# Patient Record
Sex: Female | Born: 1995 | Race: Black or African American | Hispanic: No | Marital: Single | Smoking: Never smoker
Health system: Southern US, Community
[De-identification: ages and names within clinical notes are randomized; demographics above are authoritative.]

## PROBLEM LIST (undated history)

## (undated) DIAGNOSIS — R51 Headache: Secondary | ICD-10-CM

## (undated) DIAGNOSIS — A6000 Herpesviral infection of urogenital system, unspecified: Secondary | ICD-10-CM

## (undated) DIAGNOSIS — M25562 Pain in left knee: Secondary | ICD-10-CM

## (undated) DIAGNOSIS — R519 Headache, unspecified: Secondary | ICD-10-CM

## (undated) DIAGNOSIS — G43109 Migraine with aura, not intractable, without status migrainosus: Secondary | ICD-10-CM

## (undated) HISTORY — DX: Pain in left knee: M25.562

## (undated) HISTORY — DX: Migraine with aura, not intractable, without status migrainosus: G43.109

---
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
## 2017-01-19 IMAGING — US US BREAST LTD UNI LEFT INC AXILLA
1 series · 5 of 5 positions shown · non-contrast
Comparison: No prior exams.

CLINICAL DATA: Palpable abnormalities within the upper and outer
aspects of the left breast. Per the ordering physician, these
palpable masses have clinically enlarged over a period of 1 year.

EXAM:
ULTRASOUND OF THE LEFT BREAST

[Series 1: us breast ltd uni left inc axilla · 0.08mm/px · 5 of 5 slices shown]
[im 1/5]
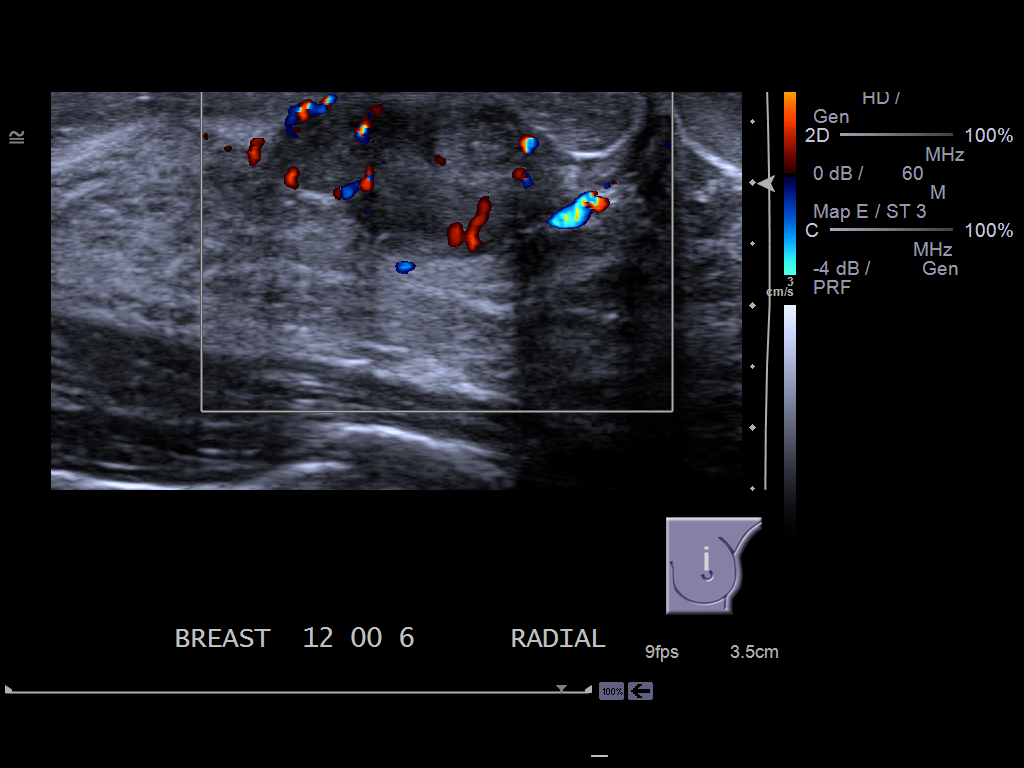
[im 2/5]
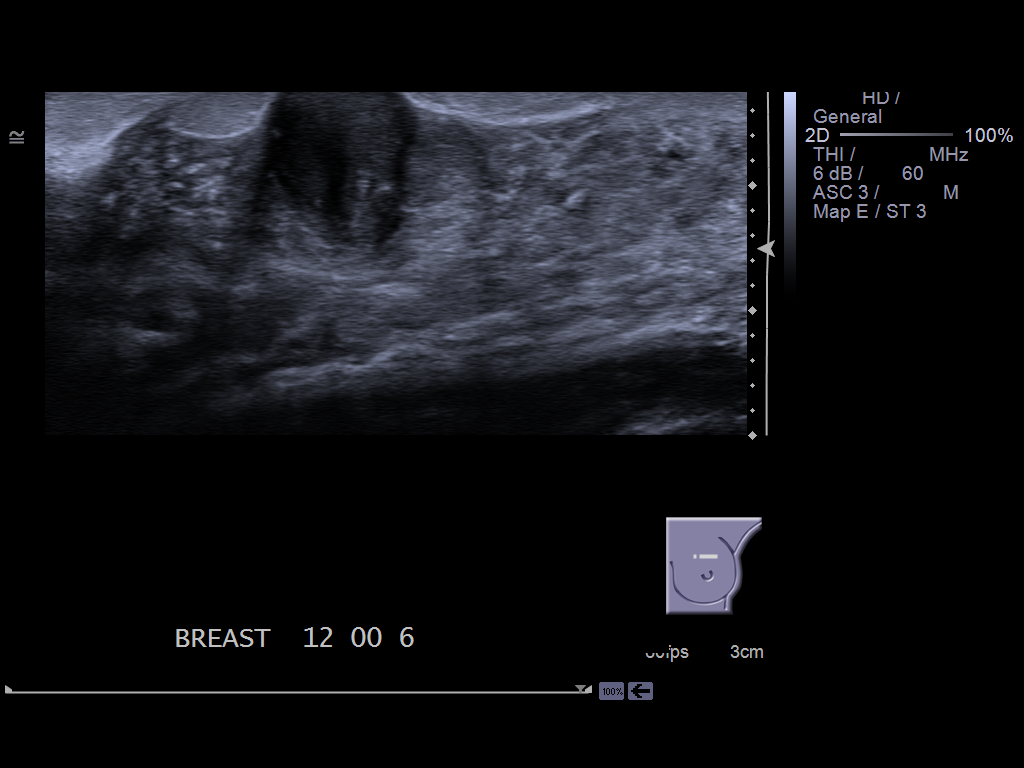
[im 3/5]
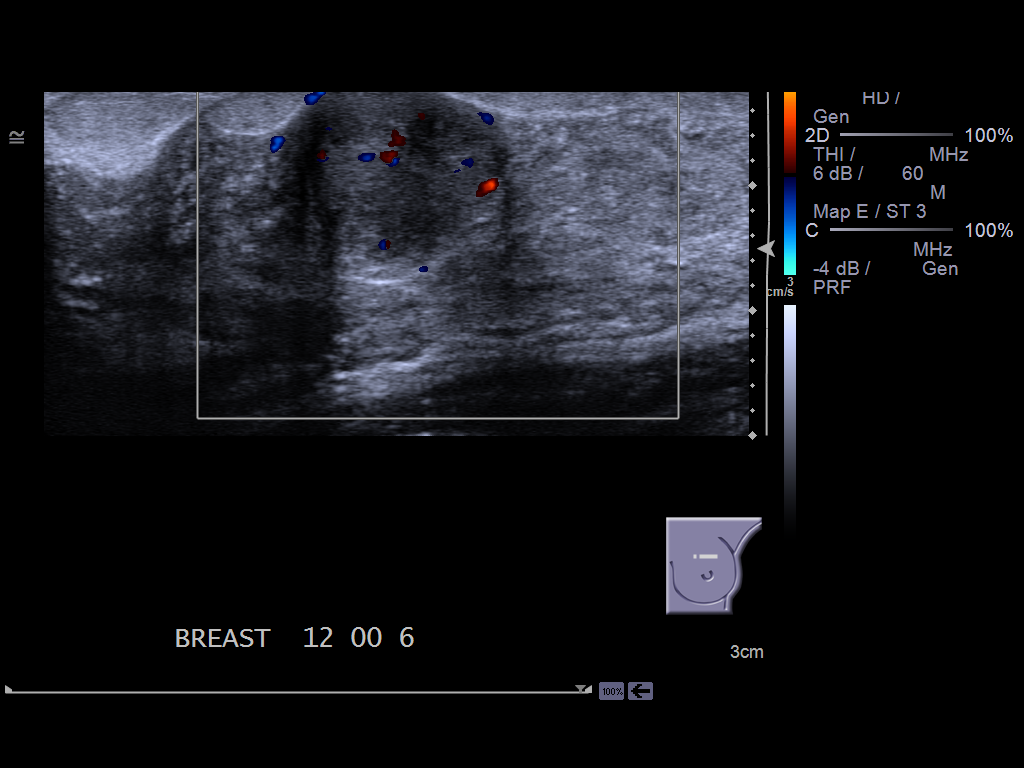
[im 4/5]
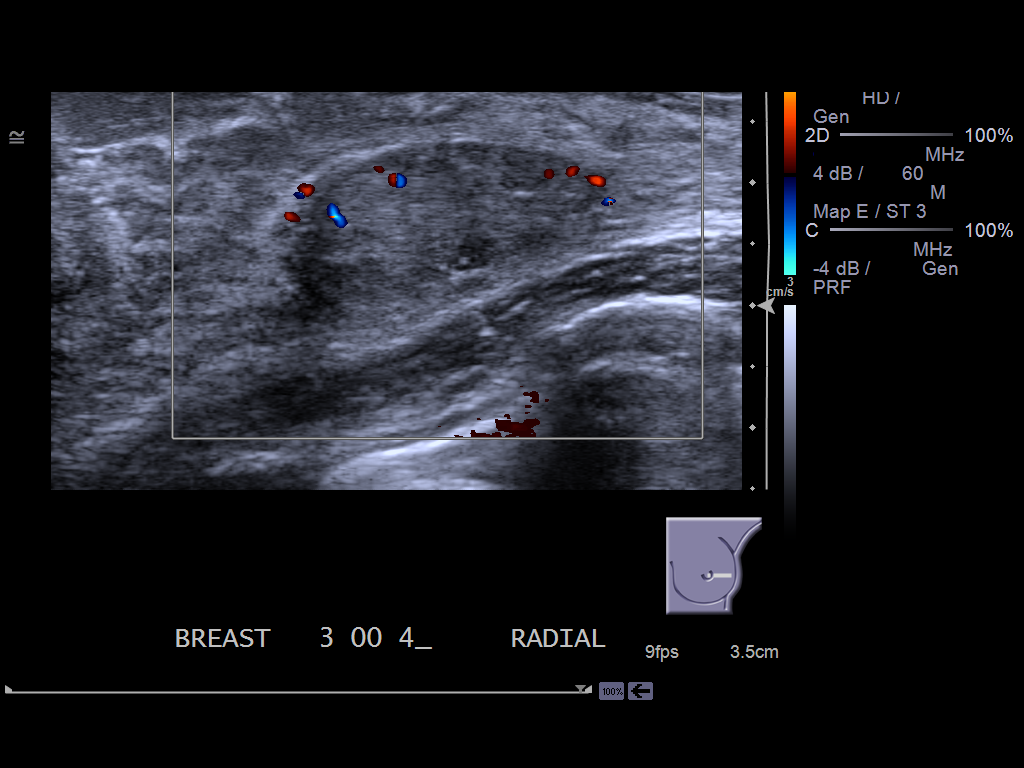
[im 5/5]
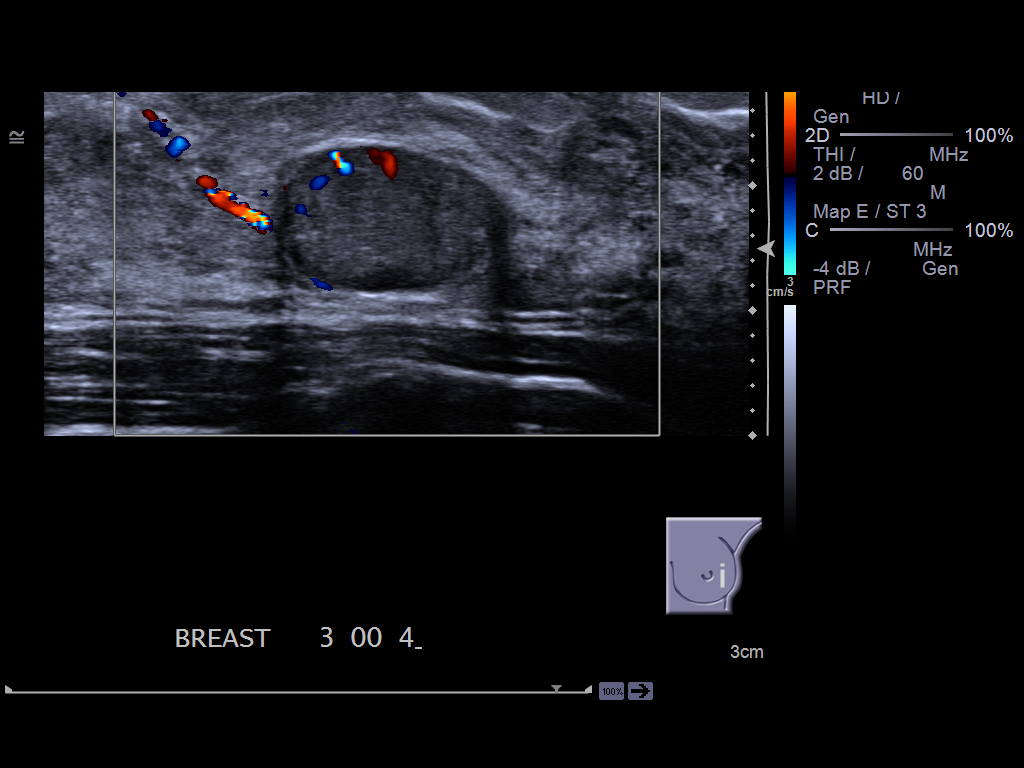

[5 of 5 positions shown; findings below may reference images not displayed]

FINDINGS: On physical exam, palpable masses are felt at the 12 o'clock and 3
o'clock axes of the left breast.

Targeted ultrasound is performed, showing an oval circumscribed
hypoechoic mass within the left breast at the 3 o'clock axis, 4 cm
from the nipple, measuring 3.2 x 1.4 x 2.0 cm, corresponding to the
palpable abnormality.

There is an additional oval circumscribed hypoechoic mass, slightly
lobular, within the left breast at the 12 o'clock axis, 6 cm from
the nipple, measuring 2.7 x 1.4 x 1.8 cm, probably 2 abutting
masses, corresponding to the palpable abnormality.
IMPRESSION: Oval circumscribed hypoechoic masses within the left breast at the 3
o'clock axis and 12 o'clock axis as detailed above. While these are
most likely benign fibroadenomas, ordering physician tells me that
both have clinically enlarged in the past year so ultrasound-guided
biopsy is recommended to ensure benignity.

RECOMMENDATION:
Ultrasound-guided biopsy of either mass within the left breast.
Pathology result can be representative of both masses.

I have discussed the findings and recommendations with the patient.
Results were also provided in writing at the conclusion of the
visit. If applicable, a reminder letter will be sent to the patient
regarding the next appointment.

Findings and recommendations also discussed with the ordering
physician, Dr. On, on 02/13/2015 at 4 p.m.

BI-RADS CATEGORY  4: Suspicious abnormality - biopsy should be
considered.

## 2017-02-08 ENCOUNTER — Ambulatory Visit (INDEPENDENT_AMBULATORY_CARE_PROVIDER_SITE_OTHER): Payer: Medicaid Other | Admitting: Obstetrics & Gynecology

## 2017-02-08 ENCOUNTER — Encounter: Payer: Self-pay | Admitting: Obstetrics & Gynecology

## 2017-02-08 VITALS — BP 120/70 | Ht 60.0 in | Wt 114.0 lb

## 2017-02-08 DIAGNOSIS — Z309 Encounter for contraceptive management, unspecified: Secondary | ICD-10-CM | POA: Diagnosis not present

## 2017-02-08 DIAGNOSIS — Z3042 Encounter for surveillance of injectable contraceptive: Secondary | ICD-10-CM

## 2017-02-08 DIAGNOSIS — Z113 Encounter for screening for infections with a predominantly sexual mode of transmission: Secondary | ICD-10-CM

## 2017-02-08 DIAGNOSIS — Z0000 Encounter for general adult medical examination without abnormal findings: Secondary | ICD-10-CM

## 2017-02-08 MED ORDER — MEDROXYPROGESTERONE ACETATE 150 MG/ML IM SUSP: INTRAMUSCULAR | 3 refills | Status: DC

## 2017-02-08 NOTE — Progress Notes (Signed)
HPI:      [name] is a 21 y.o. G0P0000 who LMP was No LMP recorded. Patient has had an injection., she presents today for her annual examination. The patient has no complaints today. The patient is sexually active. Her no prior history of gyn screening tests. The patient does perform self breast exams.  There is no notable family history of breast or ovarian cancer in her family.  The patient has regular exercise: yes.  The patient denies current symptoms of depression.    GYN History: Contraception: Depo-Provera injections  PMHx: Past Medical History:  Diagnosis Date  . Headache   . Left knee pain    Past Surgical History:  Procedure Laterality Date  . BREAST LUMPECTOMY Left 03/01/2015   Procedure: BREAST LUMPECTOMY/ MASS EXCISION X 2;  Surgeon: Christene Lye, MD;  Location: [facility];  Service: General;  Laterality: Left;   Family History  Problem Relation Age of Onset  . Diabetes Maternal Grandmother    Social History  Substance Use Topics  . Smoking status: Never Smoker  . Smokeless tobacco: Never Used  . Alcohol use No    Current Outpatient Prescriptions:  .  medroxyPROGESTERone (DEPO-PROVERA) 150 MG/ML injection, INJECT 1ML BY ITRAMUSCLAR ROUTE EVERY 3 MONTHS, Disp: 1 mL, Rfl: 3 .  SUMAtriptan (IMITREX) 50 MG tablet, Take 50 mg by mouth every 2 (two) hours as needed for migraine. May repeat in 2 hours if headache persists or recurs., Disp: , Rfl:  Allergies: Penicillins  Review of Systems  Constitutional: Negative for chills, fever and malaise/fatigue.  HENT: Negative for congestion, sinus pain and sore throat.   Eyes: Negative for blurred vision and pain.  Respiratory: Negative for cough and wheezing.   Cardiovascular: Negative for chest pain and leg swelling.  Gastrointestinal: Negative for abdominal pain, constipation, diarrhea, heartburn, nausea and vomiting.  Genitourinary: Negative for dysuria, frequency, hematuria and urgency.    Musculoskeletal: Negative for back pain, joint pain, myalgias and neck pain.  Skin: Negative for itching and rash.  Neurological: Negative for dizziness, tremors and weakness.  Endo/Heme/Allergies: Does not bruise/bleed easily.  Psychiatric/Behavioral: Negative for depression. The patient is not nervous/anxious and does not have insomnia.     Objective: BP 120/70   Ht 5' (1.524 m)   Wt 114 lb (51.7 kg)   BMI 22.26 kg/m   Filed Weights   02/08/17 1021  Weight: 114 lb (51.7 kg)   Body mass index is 22.26 kg/m. Physical Exam  Constitutional: She is oriented to person, place, and time. She appears well-developed and well-nourished. No distress.  Genitourinary: Rectum normal, vagina normal and uterus normal. Pelvic exam was performed with patient supine. There is no rash or lesion on the right labia. There is no rash or lesion on the left labia. Vagina exhibits no lesion. No bleeding in the vagina. Right adnexum does not display mass and does not display tenderness. Left adnexum does not display mass and does not display tenderness. Cervix does not exhibit motion tenderness, lesion, friability or polyp.   Uterus is mobile and midaxial. Uterus is not enlarged or exhibiting a mass.  HENT:  Head: Normocephalic and atraumatic. Head is without laceration.  Right Ear: Hearing normal.  Left Ear: Hearing normal.  Nose: No epistaxis.  No foreign bodies.  Mouth/Throat: Uvula is midline, oropharynx is clear and moist and mucous membranes are normal.  Eyes: Pupils are equal, round, and reactive to light.  Neck: Normal range of motion. Neck supple. No  thyromegaly present.  Cardiovascular: Normal rate and regular rhythm.  Exam reveals no gallop and no friction rub.   No murmur heard. Pulmonary/Chest: Effort normal and breath sounds normal. No respiratory distress. She has no wheezes. Right breast exhibits no mass, no skin change and no tenderness. Left breast exhibits no mass, no skin change and no  tenderness.  Abdominal: Soft. Bowel sounds are normal. She exhibits no distension. There is no tenderness. There is no rebound.  Musculoskeletal: Normal range of motion.  Neurological: She is alert and oriented to person, place, and time. No cranial nerve deficit.  Skin: Skin is warm and dry.  Psychiatric: She has a normal mood and affect. Judgment normal.  Vitals reviewed.   Assessment:  ANNUAL EXAM 1. Annual physical exam   2. Screen for STD (sexually transmitted disease)   3. Encounter for surveillance of injectable contraceptive      Screening Plan:            1.  Cervical Screening-  Pap smear not performed, (await til age 17)  2. Breast screening- Exam annually and mammogram>40 planned   3. Colonoscopy every 10 years, Hemoccult testing - after age 3  4. Labs managed by PCP  5. Counseling for contraception: Depo-Provera  Other:  1. Annual physical exam  2. Screen for STD (sexually transmitted disease) - GC/Chlamydia Probe Amp  3. Encounter for surveillance of injectable contraceptive -Cont Depo    F/U  Return in about 1 year (around 02/08/2018) for Annual.  Barnett Applebaum, MD, [facility], [facility] 02/08/2017  10:43 AM

## 2017-02-08 NOTE — Patient Instructions (Signed)

## 2017-02-10 LAB — GC/CHLAMYDIA PROBE AMP
Chlamydia trachomatis, NAA: NEGATIVE
NEISSERIA GONORRHOEAE BY PCR: NEGATIVE

## 2017-03-10 ENCOUNTER — Other Ambulatory Visit: Payer: Self-pay

## 2017-03-10 MED ORDER — MEDROXYPROGESTERONE ACETATE 150 MG/ML IM SUSP: INTRAMUSCULAR | 0 refills | Status: DC

## 2017-03-10 NOTE — Telephone Encounter (Signed)
Refilling depo one time so pt can come get her injection tomorrow in office. Pt aware

## 2017-03-11 ENCOUNTER — Ambulatory Visit (INDEPENDENT_AMBULATORY_CARE_PROVIDER_SITE_OTHER): Payer: Medicaid Other

## 2017-03-11 DIAGNOSIS — Z3042 Encounter for surveillance of injectable contraceptive: Secondary | ICD-10-CM

## 2017-03-11 DIAGNOSIS — Z309 Encounter for contraceptive management, unspecified: Secondary | ICD-10-CM | POA: Diagnosis not present

## 2017-03-11 MED ORDER — MEDROXYPROGESTERONE ACETATE 150 MG/ML IM SUSP
150.0000 mg | Freq: Once | INTRAMUSCULAR | Status: AC
  Administered 2017-03-11: 150 mg via INTRAMUSCULAR

## 2017-03-12 ENCOUNTER — Ambulatory Visit: Payer: Self-pay

## 2017-03-15 ENCOUNTER — Ambulatory Visit: Payer: Medicaid Other

## 2017-06-04 ENCOUNTER — Ambulatory Visit: Payer: Medicaid Other

## 2017-06-10 ENCOUNTER — Other Ambulatory Visit: Payer: Self-pay | Admitting: Obstetrics & Gynecology

## 2017-06-24 ENCOUNTER — Ambulatory Visit (INDEPENDENT_AMBULATORY_CARE_PROVIDER_SITE_OTHER): Payer: Medicaid Other

## 2017-06-24 DIAGNOSIS — Z309 Encounter for contraceptive management, unspecified: Secondary | ICD-10-CM | POA: Diagnosis not present

## 2017-06-24 DIAGNOSIS — N912 Amenorrhea, unspecified: Secondary | ICD-10-CM | POA: Diagnosis not present

## 2017-06-24 DIAGNOSIS — Z3042 Encounter for surveillance of injectable contraceptive: Secondary | ICD-10-CM

## 2017-06-24 LAB — POCT URINE PREGNANCY: Preg Test, Ur: NEGATIVE

## 2017-06-24 MED ORDER — MEDROXYPROGESTERONE ACETATE 150 MG/ML IM SUSP
150.0000 mg | Freq: Once | INTRAMUSCULAR | Status: AC
  Administered 2017-06-24: 150 mg via INTRAMUSCULAR

## 2017-09-17 ENCOUNTER — Ambulatory Visit: Payer: Medicaid Other

## 2018-05-23 ENCOUNTER — Other Ambulatory Visit (HOSPITAL_COMMUNITY)
Admission: RE | Admit: 2018-05-23 | Discharge: 2018-05-23 | Disposition: A | Discharge status: Home / Self Care | Payer: BLUE CROSS/BLUE SHIELD | Source: Ambulatory Visit | Attending: Obstetrics & Gynecology | Admitting: Obstetrics & Gynecology

## 2018-05-23 ENCOUNTER — Ambulatory Visit (INDEPENDENT_AMBULATORY_CARE_PROVIDER_SITE_OTHER): Payer: Medicaid Other | Admitting: Obstetrics & Gynecology

## 2018-05-23 ENCOUNTER — Encounter: Payer: Self-pay | Admitting: Obstetrics & Gynecology

## 2018-05-23 VITALS — BP 100/60 | Ht 61.0 in | Wt 117.0 lb

## 2018-05-23 DIAGNOSIS — Z0000 Encounter for general adult medical examination without abnormal findings: Secondary | ICD-10-CM | POA: Diagnosis not present

## 2018-05-23 DIAGNOSIS — Z113 Encounter for screening for infections with a predominantly sexual mode of transmission: Secondary | ICD-10-CM

## 2018-05-23 DIAGNOSIS — Z333 Pregnant state, gestational carrier: Secondary | ICD-10-CM | POA: Diagnosis present

## 2018-05-23 NOTE — Patient Instructions (Signed)
Birth control pill pack for one month    Then monitor for cycle regularity  Consider optometry and / or neurology for visual changes and headache investigation

## 2018-05-23 NOTE — Progress Notes (Signed)
HPI:      [name] is a 22 y.o. G0P0000 who LMP was Patient's last menstrual period was 05/16/2018., she presents today for her annual examination. The patient has no complaints today. The patient is sexually active. Her last STD screen was 2018 and normal. The patient does perform self breast exams.  There is no notable family history of breast or ovarian cancer in her family.  The patient has regular exercise: yes.  The patient denies current symptoms of depression.  Visual changes at times, worse in am.  Can be assoc w Headaches (precedes at times)  GYN History: Contraception: none and she stopped Depo in Mar 2019 and still has irreg periods w recent extra bleeding.  PMHx: Past Medical History:  Diagnosis Date  . Headache   . Left knee pain    Past Surgical History:  Procedure Laterality Date  . BREAST LUMPECTOMY Left 03/01/2015   Procedure: BREAST LUMPECTOMY/ MASS EXCISION X 2;  Surgeon: Christene Lye, MD;  Location: [facility] ORS;  Service: General;  Laterality: Left;   Family History  Problem Relation Age of Onset  . Diabetes Maternal Grandmother    Social History   Tobacco Use  . Smoking status: Never Smoker  . Smokeless tobacco: Never Used  Substance Use Topics  . Alcohol use: No    Alcohol/week: 0.0 standard drinks  . Drug use: No    Current Outpatient Medications:  Marland Kitchen  MedroxyPROGESTERone Acetate 150 MG/ML SUSY, INJECT 1ML BY ITRAMUSCLAR ROUTE EVERY 3 MONTHS (Patient not taking: Reported on 05/23/2018), Disp: 1 Syringe, Rfl: 3 .  SUMAtriptan (IMITREX) 50 MG tablet, Take 50 mg by mouth every 2 (two) hours as needed for migraine. May repeat in 2 hours if headache persists or recurs., Disp: , Rfl:  Allergies: Penicillins  Review of Systems  Constitutional: Negative for chills, fever and malaise/fatigue.  HENT: Negative for congestion, sinus pain and sore throat.   Eyes: Negative for blurred vision and pain.       Visual changes  Respiratory: Negative  for cough and wheezing.   Cardiovascular: Negative for chest pain and leg swelling.  Gastrointestinal: Negative for abdominal pain, constipation, diarrhea, heartburn, nausea and vomiting.  Genitourinary: Negative for dysuria, frequency, hematuria and urgency.  Musculoskeletal: Negative for back pain, joint pain, myalgias and neck pain.  Skin: Negative for itching and rash.  Neurological: Positive for headaches. Negative for dizziness, tremors and weakness.  Endo/Heme/Allergies: Does not bruise/bleed easily.  Psychiatric/Behavioral: Negative for depression. The patient is not nervous/anxious and does not have insomnia.     Objective: BP 100/60   Ht 5\' 1"  (1.549 m)   Wt 117 lb (53.1 kg)   LMP 05/16/2018   BMI 22.11 kg/m   Filed Weights   05/23/18 1018  Weight: 117 lb (53.1 kg)   Body mass index is 22.11 kg/m. Physical Exam  Constitutional: She is oriented to person, place, and time. She appears well-developed and well-nourished. No distress.  Genitourinary: Rectum normal, vagina normal and uterus normal. Pelvic exam was performed with patient supine. There is no rash or lesion on the right labia. There is no rash or lesion on the left labia. Vagina exhibits no lesion. No bleeding in the vagina. Right adnexum does not display mass and does not display tenderness. Left adnexum does not display mass and does not display tenderness. Cervix does not exhibit motion tenderness, lesion, friability or polyp.   Uterus is mobile and midaxial. Uterus is not enlarged or exhibiting  a mass.  HENT:  Head: Normocephalic and atraumatic. Head is without laceration.  Right Ear: Hearing normal.  Left Ear: Hearing normal.  Nose: No epistaxis.  No foreign bodies.  Mouth/Throat: Uvula is midline, oropharynx is clear and moist and mucous membranes are normal.  Eyes: Pupils are equal, round, and reactive to light.  Neck: Normal range of motion. Neck supple. No thyromegaly present.  Cardiovascular: Normal  rate and regular rhythm. Exam reveals no gallop and no friction rub.  No murmur heard. Pulmonary/Chest: Effort normal and breath sounds normal. No respiratory distress. She has no wheezes. Right breast exhibits no mass, no skin change and no tenderness. Left breast exhibits no mass, no skin change and no tenderness.  Abdominal: Soft. Bowel sounds are normal. She exhibits no distension. There is no tenderness. There is no rebound.  Musculoskeletal: Normal range of motion.  Neurological: She is alert and oriented to person, place, and time. No cranial nerve deficit.  Skin: Skin is warm and dry.  Psychiatric: She has a normal mood and affect. Judgment normal.  Vitals reviewed.   Assessment:  ANNUAL EXAM 1. Annual physical exam   2. Screen for STD (sexually transmitted disease)      Screening Plan:            1.  Cervical Screening-  DNA probe for chlamydia and GC obtained  2. Breast screening- Exam annually and mammogram>40 planned   3. Colonoscopy every 10 years, Hemoccult testing - after age 9  4. Labs managed by PCP  5. Counseling for contraception: no method, she desires to stay off at this time.  Will take OCP for one month to help regulate cycle again.  Future contraception options discussed   6. Pt counseled to see optometry or even neurology surrounding visual changes she sometimes experiences and also w headache mgt    F/U  Return in about 1 year (around 05/24/2019) for Annual.  Barnett Applebaum, MD, [facility], [facility] 05/23/2018  10:41 AM

## 2018-05-24 LAB — GC/CHLAMYDIA PROBE AMP (~~LOC~~) NOT AT ARMC
Chlamydia: NEGATIVE
Neisseria Gonorrhea: NEGATIVE

## 2018-09-26 ENCOUNTER — Ambulatory Visit: Payer: BLUE CROSS/BLUE SHIELD | Admitting: Family Medicine

## 2018-11-10 ENCOUNTER — Other Ambulatory Visit: Payer: Self-pay

## 2018-11-10 ENCOUNTER — Encounter: Payer: Self-pay | Admitting: Family Medicine

## 2018-11-10 ENCOUNTER — Ambulatory Visit: Payer: BLUE CROSS/BLUE SHIELD | Admitting: Family Medicine

## 2018-11-10 VITALS — BP 100/60 | HR 72 | Ht 61.0 in | Wt 114.0 lb

## 2018-11-10 DIAGNOSIS — Z7689 Persons encountering health services in other specified circumstances: Secondary | ICD-10-CM | POA: Diagnosis not present

## 2018-11-10 NOTE — Progress Notes (Signed)
Date:  11/10/2018   Name:  Natasha Suarez   DOB:  Mar 01, 1996   MRN:  [id number]   Chief Complaint: Establish Care (needs to get established- aged out of peds)  Patient is a 23 year old female who presents for a comprehensive physical exam. The patient reports the following problems: none. Health maintenance has been reviewed up to date.   Review of Systems  Constitutional: Negative.  Negative for chills, fatigue, fever and unexpected weight change.  HENT: Negative for congestion, ear discharge, ear pain, rhinorrhea, sinus pressure, sneezing and sore throat.   Eyes: Negative for photophobia, pain, discharge, redness and itching.  Respiratory: Negative for cough, shortness of breath, wheezing and stridor.   Gastrointestinal: Negative for abdominal pain, blood in stool, constipation, diarrhea, nausea and vomiting.  Endocrine: Negative for cold intolerance, heat intolerance, polydipsia, polyphagia and polyuria.  Genitourinary: Negative for dysuria, flank pain, frequency, hematuria, menstrual problem, pelvic pain, urgency, vaginal bleeding and vaginal discharge.  Musculoskeletal: Negative for arthralgias, back pain and myalgias.  Skin: Negative for rash.  Allergic/Immunologic: Negative for environmental allergies and food allergies.  Neurological: Negative for dizziness, weakness, light-headedness, numbness and headaches.  Hematological: Negative for adenopathy. Does not bruise/bleed easily.  Psychiatric/Behavioral: Negative for dysphoric mood. The patient is not nervous/anxious.     There are no active problems to display for this patient.   Allergies  Allergen Reactions  . Penicillins Rash    Past Surgical History:  Procedure Laterality Date  . BREAST LUMPECTOMY Left 03/01/2015   Procedure: BREAST LUMPECTOMY/ MASS EXCISION X 2;  Surgeon: Christene Lye, MD;  Location: [facility] ORS;  Service: General;  Laterality: Left;    Social History   Tobacco Use  . Smoking status:  Never Smoker  . Smokeless tobacco: Never Used  Substance Use Topics  . Alcohol use: Yes    Alcohol/week: 0.0 standard drinks    Comment: socially  . Drug use: No     Medication list has been reviewed and updated.  Current Meds  Medication Sig  . SUMAtriptan (IMITREX) 50 MG tablet Take 50 mg by mouth every 2 (two) hours as needed for migraine. May repeat in 2 hours if headache persists or recurs.    PHQ 2/9 Scores 11/10/2018  PHQ - 2 Score 0  PHQ- 9 Score 0    BP Readings from Last 3 Encounters:  11/10/18 100/60  05/23/18 100/60  02/08/17 120/70    Physical Exam Vitals signs and nursing note reviewed.  Constitutional:      General: She is not in acute distress.    Appearance: She is not diaphoretic.  HENT:     Head: Normocephalic and atraumatic.     Right Ear: Tympanic membrane, ear canal and external ear normal.     Left Ear: Tympanic membrane, ear canal and external ear normal.     Nose: Nose normal.     Mouth/Throat:     Mouth: Mucous membranes are moist.  Eyes:     General:        Right eye: No discharge.        Left eye: No discharge.     Conjunctiva/sclera: Conjunctivae normal.     Pupils: Pupils are equal, round, and reactive to light.  Neck:     Musculoskeletal: Normal range of motion and neck supple.     Thyroid: No thyromegaly.     Vascular: No JVD.  Cardiovascular:     Rate and Rhythm: Normal rate and regular rhythm.  Heart sounds: Normal heart sounds. No murmur. No friction rub. No gallop.   Pulmonary:     Effort: Pulmonary effort is normal.     Breath sounds: Normal breath sounds. No stridor. No wheezing, rhonchi or rales.  Abdominal:     General: Bowel sounds are normal.     Palpations: Abdomen is soft. There is no mass.     Tenderness: There is no abdominal tenderness. There is no guarding.  Musculoskeletal: Normal range of motion.  Lymphadenopathy:     Cervical: No cervical adenopathy.  Skin:    General: Skin is warm and dry.   Neurological:     Mental Status: She is alert.     Deep Tendon Reflexes: Reflexes are normal and symmetric.     Wt Readings from Last 3 Encounters:  11/10/18 114 lb (51.7 kg)  05/23/18 117 lb (53.1 kg)  02/08/17 114 lb (51.7 kg) (24 %, Z= -0.70)*   * Growth percentiles are based on CDC (Girls, 2-20 Years) data.    BP 100/60   Pulse 72   Ht 5\' 1"  (1.549 m)   Wt 114 lb (51.7 kg)   LMP 11/03/2018 (Approximate)   BMI 21.54 kg/m   Assessment and Plan:  1. Establishing care with new doctor, encounter for Patient establish care with new physician.  Any  previous visits, lab, and procedures were reviewed.  Maintenance was reviewed up to date.

## 2018-12-14 ENCOUNTER — Ambulatory Visit (INDEPENDENT_AMBULATORY_CARE_PROVIDER_SITE_OTHER): Payer: BLUE CROSS/BLUE SHIELD | Admitting: Family Medicine

## 2018-12-14 ENCOUNTER — Encounter: Payer: Self-pay | Admitting: Family Medicine

## 2018-12-14 ENCOUNTER — Other Ambulatory Visit
Admission: RE | Admit: 2018-12-14 | Discharge: 2018-12-14 | Disposition: A | Discharge status: Home / Self Care | Payer: BLUE CROSS/BLUE SHIELD | Attending: Family Medicine | Admitting: Family Medicine

## 2018-12-14 ENCOUNTER — Other Ambulatory Visit: Payer: Self-pay

## 2018-12-14 VITALS — BP 120/70 | HR 72 | Ht 61.0 in | Wt 115.0 lb

## 2018-12-14 DIAGNOSIS — N898 Other specified noninflammatory disorders of vagina: Secondary | ICD-10-CM | POA: Insufficient documentation

## 2018-12-14 DIAGNOSIS — Z113 Encounter for screening for infections with a predominantly sexual mode of transmission: Secondary | ICD-10-CM | POA: Diagnosis not present

## 2018-12-14 LAB — CHLAMYDIA/NGC RT PCR (ARMC ONLY)
Chlamydia Tr: NOT DETECTED
N gonorrhoeae: NOT DETECTED

## 2018-12-14 NOTE — Progress Notes (Signed)
Date:  12/14/2018   Name:  Natasha Suarez   DOB:  [birth year]   MRN:  [id number]   Chief Complaint: Annual Exam (no pap or mammo)  Patient is a 23 year old female who presents for a comprehensive physical exam. The patient reports the following problems: discharge. Health maintenance has been reviewed up to date.  Vaginal Discharge The patient's primary symptoms include vaginal discharge. The patient's pertinent negatives include no genital itching, genital lesions, genital odor, genital rash, missed menses, pelvic pain or vaginal bleeding. Primary symptoms comment: light/clear. This is a recurrent problem. The current episode started 1 to 4 weeks ago (2 weeks). The problem occurs intermittently. The patient is experiencing no pain. Pertinent negatives include no abdominal pain, back pain, chills, constipation, diarrhea, dysuria, fever, flank pain, frequency, headaches, hematuria, nausea, rash, sore throat, urgency or vomiting. The vaginal discharge was clear and mucoid. There has been no bleeding.    Review of Systems  Constitutional: Negative.  Negative for chills, fatigue, fever and unexpected weight change.  HENT: Negative for congestion, ear discharge, ear pain, rhinorrhea, sinus pressure, sneezing and sore throat.   Eyes: Negative for photophobia, pain, discharge, redness and itching.  Respiratory: Negative for cough, shortness of breath, wheezing and stridor.   Gastrointestinal: Negative for abdominal pain, blood in stool, constipation, diarrhea, nausea and vomiting.  Endocrine: Negative for cold intolerance, heat intolerance, polydipsia, polyphagia and polyuria.  Genitourinary: Positive for vaginal discharge. Negative for dysuria, flank pain, frequency, hematuria, menstrual problem, missed menses, pelvic pain, urgency and vaginal bleeding.  Musculoskeletal: Negative for arthralgias, back pain and myalgias.  Skin: Negative for rash.  Allergic/Immunologic: Negative for environmental  allergies and food allergies.  Neurological: Negative for dizziness, weakness, light-headedness, numbness and headaches.  Hematological: Negative for adenopathy. Does not bruise/bleed easily.  Psychiatric/Behavioral: Negative for dysphoric mood. The patient is not nervous/anxious.     There are no active problems to display for this patient.   Allergies  Allergen Reactions  . Penicillins Rash    Past Surgical History:  Procedure Laterality Date  . BREAST LUMPECTOMY Left 03/01/2015   Procedure: BREAST LUMPECTOMY/ MASS EXCISION X 2;  Surgeon: Christene Lye, MD;  Location: [facility] ORS;  Service: General;  Laterality: Left;    Social History   Tobacco Use  . Smoking status: Never Smoker  . Smokeless tobacco: Never Used  Substance Use Topics  . Alcohol use: Yes    Alcohol/week: 0.0 standard drinks    Comment: socially  . Drug use: No     Medication list has been reviewed and updated.  Current Meds  Medication Sig  . SUMAtriptan (IMITREX) 50 MG tablet Take 50 mg by mouth every 2 (two) hours as needed for migraine. May repeat in 2 hours if headache persists or recurs.    PHQ 2/9 Scores 11/10/2018  PHQ - 2 Score 0  PHQ- 9 Score 0    BP Readings from Last 3 Encounters:  12/14/18 120/70  11/10/18 100/60  05/23/18 100/60    Physical Exam Vitals signs and nursing note reviewed.  Constitutional:      Appearance: Normal appearance. She is well-developed and normal weight.  HENT:     Head: Normocephalic.     Jaw: There is normal jaw occlusion.     Right Ear: Hearing, tympanic membrane, ear canal and external ear normal.     Left Ear: Hearing, tympanic membrane, ear canal and external ear normal.     Nose: Nose normal.  Mouth/Throat:     Lips: Pink.     Mouth: Mucous membranes are moist.     Tongue: No lesions. Tongue does not deviate from midline.     Palate: No mass and lesions.     Pharynx: Oropharynx is clear. Uvula midline. No pharyngeal swelling,  oropharyngeal exudate, posterior oropharyngeal erythema or uvula swelling.     Tonsils: No tonsillar exudate or tonsillar abscesses.  Eyes:     General: Lids are normal. Lids are everted, no foreign bodies appreciated. Vision grossly intact. Gaze aligned appropriately. No scleral icterus.       Left eye: No foreign body or hordeolum.     Extraocular Movements: Extraocular movements intact.     Conjunctiva/sclera: Conjunctivae normal.     Right eye: Right conjunctiva is not injected.     Left eye: Left conjunctiva is not injected.     Pupils: Pupils are equal, round, and reactive to light.  Neck:     Musculoskeletal: Full passive range of motion without pain, normal range of motion and neck supple.     Thyroid: No thyroid mass, thyromegaly or thyroid tenderness.     Vascular: No JVD.     Trachea: No tracheal deviation.  Cardiovascular:     Rate and Rhythm: Normal rate and regular rhythm.     Chest Wall: PMI is not displaced. No thrill.     Pulses:          Carotid pulses are 2+ on the right side and 2+ on the left side.      Radial pulses are 2+ on the right side and 2+ on the left side.       Femoral pulses are 2+ on the right side and 2+ on the left side.      Popliteal pulses are 2+ on the right side and 2+ on the left side.       Dorsalis pedis pulses are 2+ on the right side and 2+ on the left side.       Posterior tibial pulses are 2+ on the right side and 2+ on the left side.     Heart sounds: Normal heart sounds, S1 normal and S2 normal. No murmur. No systolic murmur. No diastolic murmur. No friction rub. No gallop. No S3 or S4 sounds.   Pulmonary:     Effort: Pulmonary effort is normal. No respiratory distress.     Breath sounds: Normal breath sounds. No decreased air movement. No decreased breath sounds, wheezing, rhonchi or rales.  Chest:     Chest wall: No mass.     Breasts:        Right: Normal. No swelling, bleeding, inverted nipple, mass, nipple discharge, skin change or  tenderness.        Left: Normal. No swelling, bleeding, inverted nipple, mass, nipple discharge, skin change or tenderness.  Abdominal:     General: Bowel sounds are normal.     Palpations: Abdomen is soft. There is no mass.     Tenderness: There is no abdominal tenderness. There is no guarding or rebound.  Musculoskeletal: Normal range of motion.        General: No tenderness.     Right lower leg: No edema.     Left lower leg: No edema.  Lymphadenopathy:     Head:     Right side of head: No submental or submandibular adenopathy.     Left side of head: No submental or submandibular adenopathy.     Cervical:  No cervical adenopathy.     Right cervical: No superficial, deep or posterior cervical adenopathy.    Left cervical: No superficial, deep or posterior cervical adenopathy.     Upper Body:     Right upper body: No supraclavicular or axillary adenopathy.     Left upper body: No supraclavicular or axillary adenopathy.  Skin:    General: Skin is warm.     Capillary Refill: Capillary refill takes less than 2 seconds.     Findings: No rash.  Neurological:     General: No focal deficit present.     Mental Status: She is alert and oriented to person, place, and time.     Cranial Nerves: No cranial nerve deficit.     Deep Tendon Reflexes: Reflexes normal.  Psychiatric:        Mood and Affect: Mood is not anxious or depressed.        Behavior: Behavior is cooperative.     Wt Readings from Last 3 Encounters:  12/14/18 115 lb (52.2 kg)  11/10/18 114 lb (51.7 kg)  05/23/18 117 lb (53.1 kg)    BP 120/70   Pulse 72   Ht 5\' 1"  (1.549 m)   Wt 115 lb (52.2 kg)   LMP 12/08/2018 (Approximate)   BMI 21.73 kg/m   Assessment and Plan: 1. Vaginal discharge Patient has noted a vaginal discharge which is mucoid in appearance.  In the past she has had to be screened for chlamydia and was having concerns.  Will wait results of test for deciding on location of choice.  2. Screen for STD  (sexually transmitted disease) Patient will be screened with quick chlamydia/GC per outpatient screening.

## 2018-12-28 DIAGNOSIS — A6000 Herpesviral infection of urogenital system, unspecified: Secondary | ICD-10-CM

## 2018-12-28 HISTORY — DX: Herpesviral infection of urogenital system, unspecified: A60.00

## 2019-01-08 ENCOUNTER — Encounter: Payer: Self-pay | Admitting: Emergency Medicine

## 2019-01-08 ENCOUNTER — Emergency Department: Payer: BLUE CROSS/BLUE SHIELD

## 2019-01-08 ENCOUNTER — Other Ambulatory Visit: Payer: Self-pay

## 2019-01-08 ENCOUNTER — Emergency Department
Admission: EM | Admit: 2019-01-08 | Discharge: 2019-01-08 | Disposition: A | Discharge status: Home / Self Care | Payer: BLUE CROSS/BLUE SHIELD | Attending: Emergency Medicine | Admitting: Emergency Medicine

## 2019-01-08 DIAGNOSIS — Z79899 Other long term (current) drug therapy: Secondary | ICD-10-CM | POA: Insufficient documentation

## 2019-01-08 DIAGNOSIS — N3000 Acute cystitis without hematuria: Secondary | ICD-10-CM | POA: Insufficient documentation

## 2019-01-08 DIAGNOSIS — E860 Dehydration: Secondary | ICD-10-CM | POA: Insufficient documentation

## 2019-01-08 DIAGNOSIS — R112 Nausea with vomiting, unspecified: Secondary | ICD-10-CM | POA: Diagnosis present

## 2019-01-08 LAB — URINALYSIS, COMPLETE (UACMP) WITH MICROSCOPIC
Bacteria, UA: NONE SEEN
Bilirubin Urine: NEGATIVE
Glucose, UA: NEGATIVE mg/dL
Ketones, ur: 80 mg/dL — AB
Leukocytes,Ua: NEGATIVE
Nitrite: NEGATIVE
Protein, ur: 100 mg/dL — AB
Specific Gravity, Urine: 1.026 (ref 1.005–1.030)
pH: 6 (ref 5.0–8.0)

## 2019-01-08 LAB — COMPREHENSIVE METABOLIC PANEL
ALT: 14 U/L (ref 0–44)
AST: 19 U/L (ref 15–41)
Albumin: 4 g/dL (ref 3.5–5.0)
Alkaline Phosphatase: 46 U/L (ref 38–126)
Anion gap: 11 (ref 5–15)
BUN: 13 mg/dL (ref 6–20)
CO2: 22 mmol/L (ref 22–32)
Calcium: 8.7 mg/dL — ABNORMAL LOW (ref 8.9–10.3)
Chloride: 103 mmol/L (ref 98–111)
Creatinine, Ser: 0.79 mg/dL (ref 0.44–1.00)
GFR calc Af Amer: >60 mL/min (ref >60)
GFR calc non Af Amer: >60 mL/min (ref >60)
Glucose, Bld: 101 mg/dL — ABNORMAL HIGH (ref 70–99)
Potassium: 3.4 mmol/L — ABNORMAL LOW (ref 3.5–5.1)
Sodium: 136 mmol/L (ref 135–145)
Total Bilirubin: 1.3 mg/dL — ABNORMAL HIGH (ref 0.3–1.2)
Total Protein: 7.1 g/dL (ref 6.5–8.1)

## 2019-01-08 LAB — POCT PREGNANCY, URINE: Preg Test, Ur: NEGATIVE

## 2019-01-08 LAB — CBC
HCT: 38.4 % (ref 36.0–46.0)
Hemoglobin: 13.4 g/dL (ref 12.0–15.0)
MCH: 34.7 pg — ABNORMAL HIGH (ref 26.0–34.0)
MCHC: 34.9 g/dL (ref 30.0–36.0)
MCV: 99.5 fL (ref 80.0–100.0)
Platelets: 139 10*3/uL — ABNORMAL LOW (ref 150–400)
RBC: 3.86 MIL/uL — ABNORMAL LOW (ref 3.87–5.11)
RDW: 12.2 % (ref 11.5–15.5)
WBC: 6.8 10*3/uL (ref 4.0–10.5)
nRBC: 0 % (ref 0.0–0.2)

## 2019-01-08 LAB — LIPASE, BLOOD: Lipase: 24 U/L (ref 11–51)

## 2019-01-08 MED ORDER — SODIUM CHLORIDE 0.9% FLUSH: 3.0000 mL | Freq: Once | INTRAVENOUS | Status: DC

## 2019-01-08 MED ORDER — IBUPROFEN 600 MG PO TABS: 600.0000 mg | ORAL_TABLET | Freq: Three times a day (TID) | ORAL | 0 refills | Status: DC | PRN

## 2019-01-08 MED ORDER — CEPHALEXIN 500 MG PO CAPS: 500.0000 mg | ORAL_CAPSULE | Freq: Three times a day (TID) | ORAL | 0 refills | Status: AC

## 2019-01-08 MED ORDER — SODIUM CHLORIDE 0.9 % IV SOLN
2.0000 g | Freq: Once | INTRAVENOUS | Status: AC
  Administered 2019-01-08: 2 g via INTRAVENOUS
  Filled 2019-01-08: qty 20

## 2019-01-08 MED ORDER — SODIUM CHLORIDE 0.9 % IV BOLUS
1000.0000 mL | Freq: Once | INTRAVENOUS | Status: AC
  Administered 2019-01-08: 1000 mL via INTRAVENOUS

## 2019-01-08 MED ORDER — ONDANSETRON 4 MG PO TBDP: 4.0000 mg | ORAL_TABLET | Freq: Three times a day (TID) | ORAL | 0 refills | Status: DC | PRN

## 2019-01-08 MED ORDER — KETOROLAC TROMETHAMINE 30 MG/ML IJ SOLN
15.0000 mg | Freq: Once | INTRAMUSCULAR | Status: AC
  Administered 2019-01-08: 15 mg via INTRAVENOUS
  Filled 2019-01-08: qty 1

## 2019-01-08 MED ORDER — ONDANSETRON HCL 4 MG/2ML IJ SOLN
4.0000 mg | Freq: Once | INTRAMUSCULAR | Status: AC
  Administered 2019-01-08: 09:00:00 4 mg via INTRAVENOUS
  Filled 2019-01-08: qty 2

## 2019-01-08 MED ORDER — IOHEXOL 300 MG/ML  SOLN
75.0000 mL | Freq: Once | INTRAMUSCULAR | Status: AC | PRN
  Administered 2019-01-08: 75 mL via INTRAVENOUS

## 2019-01-08 NOTE — ED Provider Notes (Signed)
[facility] Emergency Department Provider Note  ____________________________________________   First MD Initiated Contact with Patient 01/08/19 0802     (approximate)  I have reviewed the triage vital signs and the nursing notes.   HISTORY  Chief Complaint Fever and Emesis    HPI Natasha Suarez is a 23 y.o. female here with fever and nausea with vomiting.  The patient states her symptoms started approximately 3 days ago.  She states that it started as a low-grade fever up to 101 Fahrenheit.  She had some mild midline lower back pain with it as well, but thought this was due to menstrual periods.  She states that over the last 24 hours, she is developed persistent nausea, vomiting, and general fatigue.  She has had no diarrhea or constipation.  She states that she has had some intermittent abdominal cramping when she vomits but this resolves very quickly.  She denies any vaginal bleeding or discharge.  No other acute complaints.  No known sick contacts, but she has been out to the mall.  No cough, shortness of breath, or known coronavirus exposures.  No recent travel.  No rash.  Her nausea is worse with any attempted eating or drinking.  No alleviating factors.        Past Medical History:  Diagnosis Date   Headache    Left knee pain     There are no active problems to display for this patient.   Past Surgical History:  Procedure Laterality Date   BREAST LUMPECTOMY Left 03/01/2015   Procedure: BREAST LUMPECTOMY/ MASS EXCISION X 2;  Surgeon: Christene Lye, MD;  Location: [facility] ORS;  Service: General;  Laterality: Left;    Prior to Admission medications   Medication Sig Start Date End Date Taking? Authorizing Provider  cephALEXin (KEFLEX) 500 MG capsule Take 1 capsule (500 mg total) by mouth 3 (three) times daily for 7 days. 01/08/19 01/15/19  Duffy Bruce, MD  ibuprofen (ADVIL) 600 MG tablet Take 1 tablet (600 mg total) by mouth every 8  (eight) hours as needed for moderate pain (back pain). 01/08/19   Duffy Bruce, MD  ondansetron (ZOFRAN ODT) 4 MG disintegrating tablet Take 1 tablet (4 mg total) by mouth every 8 (eight) hours as needed for nausea or vomiting. 01/08/19   Duffy Bruce, MD  SUMAtriptan (IMITREX) 50 MG tablet Take 50 mg by mouth every 2 (two) hours as needed for migraine. May repeat in 2 hours if headache persists or recurs.    [provider]    Allergies Penicillins  Family History  Problem Relation Age of Onset   Diabetes Maternal Grandmother    Hypertension Mother     Social History Social History   Tobacco Use   Smoking status: Never Smoker   Smokeless tobacco: Never Used  Substance Use Topics   Alcohol use: Yes    Alcohol/week: 0.0 standard drinks    Comment: socially   Drug use: No    Review of Systems  Review of Systems  Constitutional: Positive for chills and fever. Negative for fatigue.  HENT: Negative for congestion and sore throat.   Eyes: Negative for visual disturbance.  Respiratory: Negative for cough and shortness of breath.   Cardiovascular: Negative for chest pain.  Gastrointestinal: Positive for nausea and vomiting. Negative for abdominal pain and diarrhea.  Genitourinary: Negative for flank pain.  Musculoskeletal: Negative for back pain and neck pain.  Skin: Negative for rash and wound.  Neurological: Negative for weakness.  All other systems reviewed and are negative.    ____________________________________________  PHYSICAL EXAM:      VITAL SIGNS: ED Triage Vitals  Enc Vitals Group     BP 01/08/19 0722 102/83     Pulse Rate 01/08/19 0722 98     Resp 01/08/19 0722 18     Temp 01/08/19 0722 100 F (37.8 C)     Temp Source 01/08/19 0722 Oral     SpO2 01/08/19 0722 100 %     Weight 01/08/19 0722 115 lb (52.2 kg)     Height 01/08/19 0722 5\' 1"  (1.549 m)     Head Circumference --      Peak Flow --      Pain Score 01/08/19 0725 0     Pain  Loc --      Pain Edu? --      Excl. in [place]? --      Physical Exam Vitals signs and nursing note reviewed.  Constitutional:      General: She is not in acute distress.    Appearance: She is well-developed.  HENT:     Head: Normocephalic and atraumatic.     Mouth/Throat:     Mouth: Mucous membranes are dry.  Eyes:     Conjunctiva/sclera: Conjunctivae normal.  Neck:     Musculoskeletal: Neck supple.  Cardiovascular:     Rate and Rhythm: Normal rate and regular rhythm.     Heart sounds: Normal heart sounds. No murmur. No friction rub.  Pulmonary:     Effort: Pulmonary effort is normal. No respiratory distress.     Breath sounds: Normal breath sounds. No wheezing or rales.  Abdominal:     General: There is no distension.     Palpations: Abdomen is soft.     Tenderness: There is no abdominal tenderness. There is no guarding or rebound.  Skin:    General: Skin is warm.     Capillary Refill: Capillary refill takes less than 2 seconds.  Neurological:     Mental Status: She is alert and oriented to person, place, and time.     Motor: No abnormal muscle tone.       ____________________________________________   LABS (all labs ordered are listed, but only abnormal results are displayed)  Labs Reviewed  COMPREHENSIVE METABOLIC PANEL - Abnormal; Notable for the following components:      Result Value   Potassium 3.4 (*)    Glucose, Bld 101 (*)    Calcium 8.7 (*)    Total Bilirubin 1.3 (*)    All other components within normal limits  CBC - Abnormal; Notable for the following components:   RBC 3.86 (*)    MCH 34.7 (*)    Platelets 139 (*)    All other components within normal limits  URINALYSIS, COMPLETE (UACMP) WITH MICROSCOPIC - Abnormal; Notable for the following components:   Color, Urine YELLOW (*)    APPearance HAZY (*)    Hgb urine dipstick LARGE (*)    Ketones, ur 80 (*)    Protein, ur 100 (*)    All other components within normal limits  URINE CULTURE    LIPASE, BLOOD  POC URINE PREG, ED  POCT PREGNANCY, URINE    ____________________________________________  EKG:  ________________________________________  RADIOLOGY All imaging, including plain films, CT scans, and ultrasounds, independently reviewed by me, and interpretations confirmed via formal radiology reads.  ED MD interpretation:   CT: No acute abnormality, R ovarian cyst noted  Official radiology  report(s): Ct Abdomen Pelvis W Contrast  Result Date: 01/08/2019 CLINICAL DATA:  Right-sided abdominal pain, vomiting, and fever for 2 days. EXAM: CT ABDOMEN AND PELVIS WITH CONTRAST TECHNIQUE: Multidetector CT imaging of the abdomen and pelvis was performed using the standard protocol following bolus administration of intravenous contrast. CONTRAST:  67mL OMNIPAQUE IOHEXOL 300 MG/ML  SOLN COMPARISON:  None. FINDINGS: Lower Chest: No acute findings. Hepatobiliary: No hepatic masses identified. Gallbladder is unremarkable. Pancreas:  No mass or inflammatory changes. Spleen: Within normal limits in size and appearance. Adrenals/Urinary Tract: No masses identified. No evidence of hydronephrosis. Stomach/Bowel: No evidence of obstruction, inflammatory process or abnormal fluid collections. Normal appendix visualized. Vascular/Lymphatic: No pathologically enlarged lymph nodes. No abdominal aortic aneurysm. Reproductive: Normal appearance of uterus. A benign-appearing cyst is seen in the right adnexa which measures 3.4 x 2.4 cm. This most likely represents a physiologic ovarian cyst in a reproductive age female. No other masses identified. No evidence of inflammatory changes or free fluid. Other:  None. Musculoskeletal:  No suspicious bone lesions identified. IMPRESSION: 1. 3.4 cm benign-appearing right ovarian cyst, most likely physiologic in a reproductive age female. 2. No evidence of appendicitis or other inflammatory process. Electronically Signed   By: Marlaine Hind M.D.   On: 01/08/2019 11:28     ____________________________________________  PROCEDURES   Procedure(s) performed (including Critical Care):  Procedures  ____________________________________________  INITIAL IMPRESSION / MDM / [place] / ED COURSE  As part of my medical decision making, I reviewed the following data within the electronic MEDICAL RECORD NUMBER Notes from prior ED visits and [place] Controlled Substance Database      *Naesha S No was evaluated in Emergency Department on 01/08/2019 for the symptoms described in the history of present illness. She was evaluated in the context of the global COVID-19 pandemic, which necessitated consideration that the patient might be at risk for infection with the SARS-CoV-2 virus that causes COVID-19. Institutional protocols and algorithms that pertain to the evaluation of patients at risk for COVID-19 are in a state of rapid change based on information released by regulatory bodies including the CDC and federal and state organizations. These policies and algorithms were followed during the patient's care in the ED.  Some ED evaluations and interventions may be delayed as a result of limited staffing during the pandemic.*   Clinical Course as of Jan 08 1155  Sun Jan 08, 2019  0825 22 yo F here with fever, nausea, vomiting. DDx includes UTI, viral GI illness, COVID, less likely colitis. No focal RLQ TTP on initial exam, making appendicitis unlikely. No anorexia noted. Will f/u labs, sx control, and re-assess.   [CI]  1040 Labs as above.  Urinalysis does show significant ketonuria as well as mild pyuria and hematuria.  While she is on her period, this could also represent a mild UTI.  Rocephin ordered.  Given that her tenderness is now localizing to the right lower quadrant, will obtain CT to rule out appendicitis.   [CI]    Clinical Course User Index [CI] Duffy Bruce, MD    Medical Decision Making: As above.  Suspect nausea, vomiting, secondary to her  complicated by UTI.  No evidence of appendicitis, cholecystitis, or other inflammatory process on CT imaging.  She has an ovarian cyst but is currently on her menstrual period, and has no pain to suggest torsion.  Will start on antibiotics.  She been given a dose of Rocephin here, which he tolerated well with no allergy.  Return precautions given.  Tolerating p.o. without difficulty.  ____________________________________________  FINAL CLINICAL IMPRESSION(S) / ED DIAGNOSES  Final diagnoses:  Acute cystitis without hematuria  Dehydration     MEDICATIONS GIVEN DURING THIS VISIT:  Medications  sodium chloride flush (NS) 0.9 % injection 3 mL (3 mLs Intravenous Not Given 01/08/19 1110)  sodium chloride 0.9 % bolus 1,000 mL (0 mLs Intravenous Stopped 01/08/19 1117)  ondansetron (ZOFRAN) injection 4 mg (4 mg Intravenous Given 01/08/19 0840)  ketorolac (TORADOL) 30 MG/ML injection 15 mg (15 mg Intravenous Given 01/08/19 0840)  cefTRIAXone (ROCEPHIN) 2 g in sodium chloride 0.9 % 100 mL IVPB (0 g Intravenous Stopped 01/08/19 1110)  sodium chloride 0.9 % bolus 1,000 mL (0 mLs Intravenous Stopped 01/08/19 1143)  iohexol (OMNIPAQUE) 300 MG/ML solution 75 mL (75 mLs Intravenous Contrast Given 01/08/19 1109)     ED Discharge Orders         Ordered    cephALEXin (KEFLEX) 500 MG capsule  3 times daily     01/08/19 1154    ondansetron (ZOFRAN ODT) 4 MG disintegrating tablet  Every 8 hours PRN     01/08/19 1154    ibuprofen (ADVIL) 600 MG tablet  Every 8 hours PRN     01/08/19 1154           Note:  This document was prepared using Dragon voice recognition software and may include unintentional dictation errors.   Duffy Bruce, MD 01/08/19 [phone]

## 2019-01-08 NOTE — ED Notes (Signed)
Patient transported to CT 

## 2019-01-08 NOTE — ED Notes (Signed)
ED Provider at bedside. 

## 2019-01-08 NOTE — ED Notes (Signed)
Urine collected and sent to lab per MD order. Pt denies complaints at this time.

## 2019-01-08 NOTE — ED Triage Notes (Signed)
Pt to ED via POV c/o fever x 2 days and vomiting that started last night. Pt states that she has vomited x 4. Pt reports that she unable to eat. Pt has been taken OTC medication for fever at home. Pt is in NAD.

## 2019-01-08 NOTE — ED Notes (Signed)
Esign not working at this time. Pt verbalized discharge instructions and has no quesitons

## 2019-01-08 NOTE — Discharge Instructions (Signed)

## 2019-01-11 LAB — URINE CULTURE

## 2019-01-17 ENCOUNTER — Encounter: Payer: Self-pay | Admitting: Obstetrics and Gynecology

## 2019-01-17 ENCOUNTER — Ambulatory Visit (INDEPENDENT_AMBULATORY_CARE_PROVIDER_SITE_OTHER): Payer: Self-pay | Admitting: Obstetrics and Gynecology

## 2019-01-17 ENCOUNTER — Other Ambulatory Visit: Payer: Self-pay

## 2019-01-17 VITALS — BP 110/70 | Ht 61.0 in | Wt 113.6 lb

## 2019-01-17 DIAGNOSIS — N898 Other specified noninflammatory disorders of vagina: Secondary | ICD-10-CM

## 2019-01-17 DIAGNOSIS — N83201 Unspecified ovarian cyst, right side: Secondary | ICD-10-CM | POA: Insufficient documentation

## 2019-01-17 NOTE — Progress Notes (Signed)
Juline Patch, MD   Chief Complaint  Patient presents with  . Vaginal Bump    outside on right side, painful x week and a half  . Follow-up    urine recheck from ER visit  . Ovarian Cyst    was told at ER she had an ovarian cyst, has questions regarding    HPI:      [name] is a 23 y.o. G0P0000 who LMP was Patient's last menstrual period was 01/04/2019 (exact date)., presents today for vaginal bump RT labia majora for about  1 1/2 wks. Area tender, hasn't drained. No other vag sx of d/c, odor. Pt seen in ED 01/08/19 with fever, vomiting. Thought to have UTI and started on keflex, but C&S ended up being neg. Had CT scan with "3.4 cm benign-appearing right ovarian cyst, most likely physiologic in a reproductive age female". Pt has had RLQ discomfort with menses the past few cycles but no other pelvic pain.  She is sex active, using condoms. No dyspareunia. Had neg STD testing 6/20 but has new partner since then.    Past Medical History:  Diagnosis Date  . Headache   . Left knee pain     Past Surgical History:  Procedure Laterality Date  . BREAST LUMPECTOMY Left 03/01/2015   Procedure: BREAST LUMPECTOMY/ MASS EXCISION X 2;  Surgeon: Christene Lye, MD;  Location: [facility] ORS;  Service: General;  Laterality: Left;    Family History  Problem Relation Age of Onset  . Diabetes Maternal Grandmother   . Hypertension Mother     Social History   Socioeconomic History  . Marital status: Single    Spouse name: Not on file  . Number of children: Not on file  . Years of education: Not on file  . Highest education level: Not on file  Occupational History  . Not on file  Social Needs  . Financial resource strain: Not on file  . Food insecurity    Worry: Not on file    Inability: Not on file  . Transportation needs    Medical: Not on file    Non-medical: Not on file  Tobacco Use  . Smoking status: Never Smoker  . Smokeless tobacco: Never Used  Substance and  Sexual Activity  . Alcohol use: Yes    Alcohol/week: 0.0 standard drinks    Comment: socially  . Drug use: No  . Sexual activity: Yes    Birth control/protection: Condom  Lifestyle  . Physical activity    Days per week: Not on file    Minutes per session: Not on file  . Stress: Not on file  Relationships  . Social Herbalist on phone: Not on file    Gets together: Not on file    Attends religious service: Not on file    Active member of club or organization: Not on file    Attends meetings of clubs or organizations: Not on file    Relationship status: Not on file  . Intimate partner violence    Fear of current or ex partner: Not on file    Emotionally abused: Not on file    Physically abused: Not on file    Forced sexual activity: Not on file  Other Topics Concern  . Not on file  Social History Narrative  . Not on file    Outpatient Medications Prior to Visit  Medication Sig Dispense Refill  . SUMAtriptan (IMITREX) 50  MG tablet Take 50 mg by mouth every 2 (two) hours as needed for migraine. May repeat in 2 hours if headache persists or recurs.    Marland Kitchen ibuprofen (ADVIL) 600 MG tablet Take 1 tablet (600 mg total) by mouth every 8 (eight) hours as needed for moderate pain (back pain). (Patient not taking: Reported on 01/17/2019) 20 tablet 0  . ondansetron (ZOFRAN ODT) 4 MG disintegrating tablet Take 1 tablet (4 mg total) by mouth every 8 (eight) hours as needed for nausea or vomiting. (Patient not taking: Reported on 01/17/2019) 20 tablet 0   No facility-administered medications prior to visit.       ROS:  Review of Systems  Constitutional: Positive for fever. Negative for fatigue and unexpected weight change.  Respiratory: Negative for cough, shortness of breath and wheezing.   Cardiovascular: Negative for chest pain, palpitations and leg swelling.  Gastrointestinal: Positive for vomiting. Negative for blood in stool, constipation, diarrhea and nausea.  Endocrine:  Negative for cold intolerance, heat intolerance and polyuria.  Genitourinary: Positive for genital sores and pelvic pain. Negative for dyspareunia, dysuria, flank pain, frequency, hematuria, menstrual problem, urgency, vaginal bleeding, vaginal discharge and vaginal pain.  Musculoskeletal: Negative for back pain, joint swelling and myalgias.  Skin: Negative for rash.  Neurological: Negative for dizziness, syncope, light-headedness, numbness and headaches.  Hematological: Negative for adenopathy.  Psychiatric/Behavioral: Negative for agitation, confusion, sleep disturbance and suicidal ideas. The patient is not nervous/anxious.     OBJECTIVE:   Vitals:  BP 110/70   Ht 5\' 1"  (1.549 m)   Wt 113 lb 9.6 oz (51.5 kg)   LMP 01/04/2019 (Exact Date)   BMI 21.46 kg/m   Physical Exam Vitals signs reviewed.  Constitutional:      Appearance: She is well-developed.  Neck:     Musculoskeletal: Normal range of motion.  Pulmonary:     Effort: Pulmonary effort is normal.  Genitourinary:    Pubic Area: No rash.      Labia:        Right: Lesion present. No rash or tenderness.        Left: No rash, tenderness or lesion.      Vagina: Normal. No vaginal discharge, erythema or tenderness.     Cervix: Normal.     Uterus: Normal. Not enlarged and not tender.      Adnexa: Right adnexa normal and left adnexa normal.       Right: No mass or tenderness.         Left: No mass or tenderness.      Musculoskeletal: Normal range of motion.  Skin:    General: Skin is warm and dry.  Neurological:     General: No focal deficit present.     Mental Status: She is alert and oriented to person, place, and time.  Psychiatric:        Mood and Affect: Mood normal.        Behavior: Behavior normal.        Thought Content: Thought content normal.        Judgment: Judgment normal.     Assessment/Plan: Vaginal lesion - Plan: Other/Misc lab test, Resolving RT labia majora. Given sx hx with fever and 10 day  ulcerative lesion, suspect HSV. Culture done. Will call with results. If neg due to almost resolved lesion, will check labs. HSV etiology, treatment, sx discussed. No meds needed today since almost gone.  Right ovarian cyst - Plan: Reassurance. Simple cyst, no pelvic pain except discomfort  with menses. F/u if sx persist for GYN u/s f/u.   No need to rechk urine C&S due to neg C&S at ED.   Return if symptoms worsen or fail to improve.  Raffaele Derise B. Ivaan Liddy, PA-C 01/17/2019 11:56 AM

## 2019-01-17 NOTE — Patient Instructions (Signed)
I value your feedback and entrusting us with your care. If you get a [facility] patient survey, I would appreciate you taking the time to let us know about your experience today. Thank you! 

## 2019-01-24 ENCOUNTER — Telehealth: Payer: Self-pay | Admitting: Obstetrics and Gynecology

## 2019-01-24 ENCOUNTER — Encounter: Payer: Self-pay | Admitting: Obstetrics and Gynecology

## 2019-01-24 ENCOUNTER — Other Ambulatory Visit: Payer: Self-pay | Admitting: Obstetrics and Gynecology

## 2019-01-24 MED ORDER — VALACYCLOVIR HCL 500 MG PO TABS: 500.0000 mg | ORAL_TABLET | Freq: Every day | ORAL | 1 refills | Status: DC

## 2019-01-24 NOTE — Progress Notes (Signed)
Rx valtrex to take daily as preventive after pos HSV 2 culture for vaginal lesion.

## 2019-01-24 NOTE — Telephone Encounter (Signed)
Pt aware of pos HSV 2 on herpes culture for vag lesion. Lesion resolving. Discussed daily vs episodic tx. Pt wants daily. Rx valtrex eRxd. F/u prn.

## 2019-01-24 NOTE — Telephone Encounter (Signed)
-----   Message from Chad Cordial, PA-C sent at 01/17/2019 11:54 AM EDT ----- Regarding: MDL herpes--7/21

## 2019-05-15 ENCOUNTER — Other Ambulatory Visit: Payer: Self-pay

## 2019-05-15 DIAGNOSIS — Z20822 Contact with and (suspected) exposure to covid-19: Secondary | ICD-10-CM

## 2019-05-17 LAB — NOVEL CORONAVIRUS, NAA: SARS-CoV-2, NAA: NOT DETECTED

## 2020-01-01 ENCOUNTER — Other Ambulatory Visit: Payer: Self-pay

## 2020-01-01 ENCOUNTER — Emergency Department: Payer: Self-pay

## 2020-01-01 ENCOUNTER — Emergency Department
Admission: EM | Admit: 2020-01-01 | Discharge: 2020-01-02 | Disposition: A | Discharge status: Home / Self Care | Payer: Self-pay | Attending: Student in an Organized Health Care Education/Training Program | Admitting: Student in an Organized Health Care Education/Training Program

## 2020-01-01 DIAGNOSIS — R5381 Other malaise: Secondary | ICD-10-CM | POA: Insufficient documentation

## 2020-01-01 DIAGNOSIS — J111 Influenza due to unidentified influenza virus with other respiratory manifestations: Secondary | ICD-10-CM

## 2020-01-01 DIAGNOSIS — R509 Fever, unspecified: Secondary | ICD-10-CM | POA: Insufficient documentation

## 2020-01-01 DIAGNOSIS — R05 Cough: Secondary | ICD-10-CM | POA: Insufficient documentation

## 2020-01-01 DIAGNOSIS — R0981 Nasal congestion: Secondary | ICD-10-CM | POA: Insufficient documentation

## 2020-01-01 DIAGNOSIS — Z20822 Contact with and (suspected) exposure to covid-19: Secondary | ICD-10-CM | POA: Insufficient documentation

## 2020-01-01 MED ORDER — ACETAMINOPHEN 500 MG PO TABS
1000.0000 mg | ORAL_TABLET | Freq: Once | ORAL | Status: AC
  Administered 2020-01-02: 1000 mg via ORAL
  Filled 2020-01-01: qty 2

## 2020-01-01 MED ORDER — DIPHENHYDRAMINE HCL 50 MG/ML IJ SOLN
12.5000 mg | Freq: Once | INTRAMUSCULAR | Status: AC
  Administered 2020-01-02: 12.5 mg via INTRAVENOUS
  Filled 2020-01-01: qty 1

## 2020-01-01 MED ORDER — PROCHLORPERAZINE EDISYLATE 10 MG/2ML IJ SOLN
10.0000 mg | Freq: Once | INTRAMUSCULAR | Status: AC
  Administered 2020-01-02: 10 mg via INTRAVENOUS
  Filled 2020-01-01: qty 2

## 2020-01-01 MED ORDER — SODIUM CHLORIDE 0.9 % IV BOLUS
1000.0000 mL | Freq: Once | INTRAVENOUS | Status: AC
  Administered 2020-01-02: 1000 mL via INTRAVENOUS

## 2020-01-01 NOTE — ED Triage Notes (Signed)
Pt states that she comes to ED today due to fever at home she has treated with theraflu and has decreased. Pt states she experienced a headache yesterday and it has stayed through today but has decreased in intensity. Pt is in no distress in triage,  Pt reports her fiance has similar symptoms.

## 2020-01-01 NOTE — ED Provider Notes (Addendum)
[facility] Emergency Department Provider Note    First MD Initiated Contact with Patient 01/01/20 2327     (approximate)  I have reviewed the triage vital signs and the nursing notes.   HISTORY  Chief Complaint Congestion, fever   HPI Natasha Suarez is a 24 y.o. female with a history of migraines presents to the ER for 24 hours of congestion, cough generalized malaise as well as fever.  States she has had a mild headache but has a history of similar headaches in the past.  Seems that her primary concern is congestion and cough with the fevers.  She is here with her fianc who has had similar symptoms.  Has not received her Covid vaccination.  Did recently travel to [place] 1 month ago.  Has not been spending much time outdoors denies any tick bites.  Denies any abdominal pain.  No vomiting.  This is not the worst headache of her life.  No dysuria she denies any neck stiffness or discomfort.  Does feel like she is having some pressure in her frontal sinuses.    Past Medical History:  Diagnosis Date  . Genital herpes 12/2018   type 2 on culture  . Headache   . Left knee pain    Family History  Problem Relation Age of Onset  . Diabetes Maternal Grandmother   . Hypertension Mother    Past Surgical History:  Procedure Laterality Date  . BREAST LUMPECTOMY Left 03/01/2015   Procedure: BREAST LUMPECTOMY/ MASS EXCISION X 2;  Surgeon: Christene Lye, MD;  Location: [facility] ORS;  Service: General;  Laterality: Left;   Patient Active Problem List   Diagnosis Date Noted  . Right ovarian cyst 01/17/2019      Prior to Admission medications   Medication Sig Start Date End Date Taking? Authorizing Provider  doxycycline (VIBRA-TABS) 100 MG tablet Take 1 tablet (100 mg total) by mouth 2 (two) times daily for 7 days. 01/02/20 01/09/20  Merlyn Lot, MD  SUMAtriptan (IMITREX) 50 MG tablet Take 50 mg by mouth every 2 (two) hours as needed for migraine. May  repeat in 2 hours if headache persists or recurs.    [provider]  valACYclovir (VALTREX) 500 MG tablet Take 1 tablet (500 mg total) by mouth daily. Or BID for 3 days prn sx 6/60/63   Copland, Deirdre Evener, PA-C    Allergies Penicillins    Social History Social History   Tobacco Use  . Smoking status: Never Smoker  . Smokeless tobacco: Never Used  Vaping Use  . Vaping Use: Never used  Substance Use Topics  . Alcohol use: Yes    Alcohol/week: 0.0 standard drinks    Comment: socially  . Drug use: No    Review of Systems Patient denies headaches, rhinorrhea, blurry vision, numbness, shortness of breath, chest pain, edema, cough, abdominal pain, nausea, vomiting, diarrhea, dysuria, fevers, rashes or hallucinations unless otherwise stated above in HPI. ____________________________________________   PHYSICAL EXAM:  VITAL SIGNS: Vitals:   01/02/20 0100 01/02/20 0225  BP: (!) 105/47 98/63  Pulse: 85 96  Resp:  16  Temp:  98.1 F (36.7 C)  SpO2: 100% 100%    Constitutional: Alert and oriented.  Eyes: Conjunctivae are normal.  Head: Atraumatic. Nose: +++ congestion/rhinnorhea with frontal sinus ttp Mouth/Throat: Mucous membranes are moist.   Neck: No stridor. Painless ROM. No nuchal rigidity or signs of meningeal irritation Cardiovascular: Normal rate, regular rhythm. Grossly normal heart sounds.  Good  peripheral circulation. Respiratory: Normal respiratory effort.  No retractions. Lungs CTAB. Gastrointestinal: Soft and nontender. No distention. No abdominal bruits. No CVA tenderness. Genitourinary:  Musculoskeletal: No lower extremity tenderness nor edema.  No joint effusions. Neurologic:  Normal speech and language. No gross focal neurologic deficits are appreciated. No facial droop Skin:  Skin is warm, dry and intact. No rash noted. Psychiatric: Mood and affect are normal. Speech and behavior are normal.  ____________________________________________    LABS (all labs ordered are listed, but only abnormal results are displayed)  Results for orders placed or performed during the hospital encounter of 01/01/20 (from the past 24 hour(s))  CBC with Differential/Platelet     Status: Abnormal   Collection Time: 01/02/20 12:07 AM  Result Value Ref Range   WBC 10.6 (H) 4.0 - 10.5 K/uL   RBC 3.79 (L) 3.87 - 5.11 MIL/uL   Hemoglobin 13.3 12.0 - 15.0 g/dL   HCT 35.8 (L) 36 - 46 %   MCV 94.5 80.0 - 100.0 fL   MCH 35.1 (H) 26.0 - 34.0 pg   MCHC 37.2 (H) 30.0 - 36.0 g/dL   RDW 12.1 11.5 - 15.5 %   Platelets 184 150 - 400 K/uL   nRBC 0.0 0.0 - 0.2 %   Neutrophils Relative % 79 %   Neutro Abs 8.4 (H) 1.7 - 7.7 K/uL   Lymphocytes Relative 7 %   Lymphs Abs 0.8 0.7 - 4.0 K/uL   Monocytes Relative 12 %   Monocytes Absolute 1.3 (H) 0 - 1 K/uL   Eosinophils Relative 1 %   Eosinophils Absolute 0.1 0 - 0 K/uL   Basophils Relative 1 %   Basophils Absolute 0.1 0 - 0 K/uL   Immature Granulocytes 0 %   Abs Immature Granulocytes 0.04 0.00 - 0.07 K/uL  Comprehensive metabolic panel     Status: Abnormal   Collection Time: 01/02/20 12:07 AM  Result Value Ref Range   Sodium 136 135 - 145 mmol/L   Potassium 3.8 3.5 - 5.1 mmol/L   Chloride 103 98 - 111 mmol/L   CO2 23 22 - 32 mmol/L   Glucose, Bld 98 70 - 99 mg/dL   BUN 18 6 - 20 mg/dL   Creatinine, Ser 0.92 0.44 - 1.00 mg/dL   Calcium 9.1 8.9 - 10.3 mg/dL   Total Protein 7.5 6.5 - 8.1 g/dL   Albumin 4.3 3.5 - 5.0 g/dL   AST 16 15 - 41 U/L   ALT 11 0 - 44 U/L   Alkaline Phosphatase 51 38 - 126 U/L   Total Bilirubin 2.2 (H) 0.3 - 1.2 mg/dL   GFR calc non Af Amer >60 >60 mL/min   GFR calc Af Amer >60 >60 mL/min   Anion gap 10 5 - 15  SARS Coronavirus 2 by RT PCR (hospital order, performed in [place] hospital lab) Nasopharyngeal Nasopharyngeal Swab     Status: None   Collection Time: 01/02/20 12:07 AM   Specimen: Nasopharyngeal Swab  Result Value Ref Range   SARS Coronavirus 2 NEGATIVE NEGATIVE   Urinalysis, Complete w Microscopic     Status: Abnormal   Collection Time: 01/02/20 12:18 AM  Result Value Ref Range   Color, Urine YELLOW (A) YELLOW   APPearance HAZY (A) CLEAR   Specific Gravity, Urine 1.028 1.005 - 1.030   pH 6.0 5.0 - 8.0   Glucose, UA NEGATIVE NEGATIVE mg/dL   Hgb urine dipstick NEGATIVE NEGATIVE   Bilirubin Urine NEGATIVE NEGATIVE  Ketones, ur 20 (A) NEGATIVE mg/dL   Protein, ur 30 (A) NEGATIVE mg/dL   Nitrite NEGATIVE NEGATIVE   Leukocytes,Ua TRACE (A) NEGATIVE   RBC / HPF 0-5 0 - 5 RBC/hpf   WBC, UA 6-10 0 - 5 WBC/hpf   Bacteria, UA RARE (A) NONE SEEN   Squamous Epithelial / LPF 11-20 0 - 5   Mucus PRESENT   Pregnancy, urine     Status: None   Collection Time: 01/02/20 12:45 AM  Result Value Ref Range   Preg Test, Ur NEGATIVE NEGATIVE   ____________________________________________  ____________________________________________  RADIOLOGY  I personally reviewed all radiographic images ordered to evaluate for the above acute complaints and reviewed radiology reports and findings.  These findings were personally discussed with the patient.  Please see medical record for radiology report.  ____________________________________________   PROCEDURES  Procedure(s) performed:  Procedures    Critical Care performed: no ____________________________________________   INITIAL IMPRESSION / ASSESSMENT AND PLAN / ED COURSE  Pertinent labs & imaging results that were available during my care of the patient were reviewed by me and considered in my medical decision making (see chart for details).   DDX: uri, sinusitis, pna, uti, rmsf, viral illness, covid, meningitis, encephalitis  Natasha Suarez is a 24 y.o. who presents to the ED with symptoms as described above.  Patient is nontoxic-appearing does have a low-grade temperature mildly tachycardic.  Will give IV fluids.  Will check blood work to evaluate for sepsis markers.  She is nontoxic-appearing and  does not have any signs of meningeal irritation. Seems that the primary sx is congestion and cough.  headache seems to be secondary complaint and similar to previous bouts.  No rigidity.  She is moving her head about without any discomfort.  is describing congestion and cough do suspect a URI.  Will order covid test, blood, cxr and ua.  Will give fluids to treat symptomatically.  Clinical Course as of Jan 02 243  Tue Jan 02, 2020  0154 Patient reassessed.  She feels well.  Her work-up thus far is reassuring.  Reassuring exam and nontoxic.  Repeat neuro exam nonfocal.  Given her cough and congestion with low-grade temperatures discussed is likely viral illness.  Covid is negative.  Will provide prescription for antibiotic for suspected sinusitis.  This is not consistent with encephalitis or meningitis.  No evidence of pneumonia.  Her abdominal exam is soft and benign.  She is tolerating p.o.  Appears appropriate for outpatient follow-up.   [PR]    Clinical Course User Index [PR] Merlyn Lot, MD    The patient was evaluated in Emergency Department today for the symptoms described in the history of present illness. He/she was evaluated in the context of the global COVID-19 pandemic, which necessitated consideration that the patient might be at risk for infection with the SARS-CoV-2 virus that causes COVID-19. Institutional protocols and algorithms that pertain to the evaluation of patients at risk for COVID-19 are in a state of rapid change based on information released by regulatory bodies including the CDC and federal and state organizations. These policies and algorithms were followed during the patient's care in the ED.  As part of my medical decision making, I reviewed the following data within the [facility] notes reviewed and incorporated, Labs reviewed, notes from prior ED visits and [place] Controlled Substance  Database   ____________________________________________   FINAL CLINICAL IMPRESSION(S) / ED DIAGNOSES  Final diagnoses:  Head congestion  Influenza-like illness  NEW MEDICATIONS STARTED DURING THIS VISIT:  Discharge Medication List as of 01/02/2020  2:14 AM    START taking these medications   Details  doxycycline (VIBRA-TABS) 100 MG tablet Take 1 tablet (100 mg total) by mouth 2 (two) times daily for 7 days., Starting Tue 01/02/2020, Until Tue 01/09/2020, Normal         Note:  This document was prepared using Dragon voice recognition software and may include unintentional dictation errors.      Merlyn Lot, MD 01/02/20 [phone]

## 2020-01-02 ENCOUNTER — Encounter: Payer: Self-pay | Admitting: Radiology

## 2020-01-02 LAB — URINALYSIS, COMPLETE (UACMP) WITH MICROSCOPIC
Bilirubin Urine: NEGATIVE
Glucose, UA: NEGATIVE mg/dL
Hgb urine dipstick: NEGATIVE
Ketones, ur: 20 mg/dL — AB
Nitrite: NEGATIVE
Protein, ur: 30 mg/dL — AB
Specific Gravity, Urine: 1.028 (ref 1.005–1.030)
pH: 6 (ref 5.0–8.0)

## 2020-01-02 LAB — COMPREHENSIVE METABOLIC PANEL
ALT: 11 U/L (ref 0–44)
AST: 16 U/L (ref 15–41)
Albumin: 4.3 g/dL (ref 3.5–5.0)
Alkaline Phosphatase: 51 U/L (ref 38–126)
Anion gap: 10 (ref 5–15)
BUN: 18 mg/dL (ref 6–20)
CO2: 23 mmol/L (ref 22–32)
Calcium: 9.1 mg/dL (ref 8.9–10.3)
Chloride: 103 mmol/L (ref 98–111)
Creatinine, Ser: 0.92 mg/dL (ref 0.44–1.00)
GFR calc Af Amer: >60 mL/min (ref >60)
GFR calc non Af Amer: >60 mL/min (ref >60)
Glucose, Bld: 98 mg/dL (ref 70–99)
Potassium: 3.8 mmol/L (ref 3.5–5.1)
Sodium: 136 mmol/L (ref 135–145)
Total Bilirubin: 2.2 mg/dL — ABNORMAL HIGH (ref 0.3–1.2)
Total Protein: 7.5 g/dL (ref 6.5–8.1)

## 2020-01-02 LAB — CBC WITH DIFFERENTIAL/PLATELET
Abs Immature Granulocytes: 0.04 10*3/uL (ref 0.00–0.07)
Basophils Absolute: 0.1 10*3/uL (ref 0.0–0.1)
Basophils Relative: 1 %
Eosinophils Absolute: 0.1 10*3/uL (ref 0.0–0.5)
Eosinophils Relative: 1 %
HCT: 35.8 % — ABNORMAL LOW (ref 36.0–46.0)
Hemoglobin: 13.3 g/dL (ref 12.0–15.0)
Immature Granulocytes: 0 %
Lymphocytes Relative: 7 %
Lymphs Abs: 0.8 10*3/uL (ref 0.7–4.0)
MCH: 35.1 pg — ABNORMAL HIGH (ref 26.0–34.0)
MCHC: 37.2 g/dL — ABNORMAL HIGH (ref 30.0–36.0)
MCV: 94.5 fL (ref 80.0–100.0)
Monocytes Absolute: 1.3 10*3/uL — ABNORMAL HIGH (ref 0.1–1.0)
Monocytes Relative: 12 %
Neutro Abs: 8.4 10*3/uL — ABNORMAL HIGH (ref 1.7–7.7)
Neutrophils Relative %: 79 %
Platelets: 184 10*3/uL (ref 150–400)
RBC: 3.79 MIL/uL — ABNORMAL LOW (ref 3.87–5.11)
RDW: 12.1 % (ref 11.5–15.5)
WBC: 10.6 10*3/uL — ABNORMAL HIGH (ref 4.0–10.5)
nRBC: 0 % (ref 0.0–0.2)

## 2020-01-02 LAB — PREGNANCY, URINE: Preg Test, Ur: NEGATIVE

## 2020-01-02 LAB — SARS CORONAVIRUS 2 BY RT PCR (HOSPITAL ORDER, PERFORMED IN ~~LOC~~ HOSPITAL LAB): SARS Coronavirus 2: NEGATIVE

## 2020-01-02 MED ORDER — DOXYCYCLINE HYCLATE 100 MG PO TABS: 100.0000 mg | ORAL_TABLET | Freq: Two times a day (BID) | ORAL | 0 refills | Status: AC

## 2020-01-02 MED ORDER — DOXYCYCLINE HYCLATE 100 MG PO TABS
100.0000 mg | ORAL_TABLET | Freq: Once | ORAL | Status: AC
  Administered 2020-01-02: 100 mg via ORAL
  Filled 2020-01-02: qty 1

## 2020-01-02 MED ORDER — DOXYCYCLINE HYCLATE 50 MG PO CAPS: 100.0000 mg | ORAL_CAPSULE | Freq: Two times a day (BID) | ORAL | 0 refills | Status: DC

## 2020-01-02 NOTE — Discharge Instructions (Addendum)
Please return to the ER or seek medical care if you develop worsening pain, persistent fevers, neck stiffness, trouble staying hydrated or for any additional questions or concerns.

## 2020-01-02 NOTE — ED Notes (Signed)
See triage note, pt reports since this morning fever of 101, sore throat and SHOB.  Pt in NAD, RR even and unlabored

## 2020-01-02 NOTE — ED Notes (Signed)
Pt ambulated with SpO2 98-100%. NAD noted

## 2020-01-02 NOTE — ED Notes (Signed)
Pt given graham crackers as requested

## 2020-03-19 ENCOUNTER — Other Ambulatory Visit: Payer: Self-pay

## 2020-03-19 ENCOUNTER — Other Ambulatory Visit: Payer: Self-pay | Admitting: Obstetrics and Gynecology

## 2020-03-19 ENCOUNTER — Telehealth: Payer: Self-pay | Admitting: Obstetrics and Gynecology

## 2020-03-19 ENCOUNTER — Ambulatory Visit: Payer: Medicaid Other | Admitting: Obstetrics and Gynecology

## 2020-03-19 DIAGNOSIS — A6004 Herpesviral vulvovaginitis: Secondary | ICD-10-CM | POA: Insufficient documentation

## 2020-03-19 MED ORDER — VALACYCLOVIR HCL 500 MG PO TABS: 500.0000 mg | ORAL_TABLET | Freq: Every day | ORAL | 0 refills | Status: DC

## 2020-03-19 NOTE — Telephone Encounter (Signed)
Pls let pt know Rx valtrex eRxd. Pls tell her we'll miss her and she's always welcome back. :-)

## 2020-03-19 NOTE — Progress Notes (Signed)
Rx RF valtrex.

## 2020-03-19 NOTE — Telephone Encounter (Signed)
Pt aware.

## 2020-03-19 NOTE — Progress Notes (Deleted)
PCP:  Juline Patch, MD   No chief complaint on file.    HPI:      Natasha Suarez is a 24 y.o. G0P0000 whose LMP was No LMP recorded. (Menstrual status: Irregular Periods)., presents today for her annual examination.  Her menses are {norm/abn:715}, lasting {number:22536} days.  Dysmenorrhea {dysmen:716}. She {does:18564} have intermenstrual bleeding.  Sex activity: {sex active:315163}.  Last Pap: never, due today Hx of STDs: HSV, takes valtrex daily  There is no FH of breast cancer. There is no FH of ovarian cancer. The patient {does:18564} do self-breast exams.  Tobacco use: {tob:20664} Alcohol use: {Alcohol:11675} No drug use.  Exercise: {exercise:31265}  She {does:18564} get adequate calcium and Vitamin D in her diet.   The pregnancy intention screening data noted above was reviewed. Potential methods of contraception were discussed. The patient elected to proceed with {Upstream End Methods:24109}.     Past Medical History:  Diagnosis Date   Genital herpes 12/2018   type 2 on culture   Headache    Left knee pain     Past Surgical History:  Procedure Laterality Date   BREAST LUMPECTOMY Left 03/01/2015   Procedure: BREAST LUMPECTOMY/ MASS EXCISION X 2;  Surgeon: Christene Lye, MD;  Location: [facility] ORS;  Service: General;  Laterality: Left;    Family History  Problem Relation Age of Onset   Diabetes Maternal Grandmother    Hypertension Mother     Social History   Socioeconomic History   Marital status: Single    Spouse name: Not on file   Number of children: Not on file   Years of education: Not on file   Highest education level: Not on file  Occupational History   Not on file  Tobacco Use   Smoking status: Never Smoker   Smokeless tobacco: Never Used  Vaping Use   Vaping Use: Never used  Substance and Sexual Activity   Alcohol use: Yes    Alcohol/week: 0.0 standard drinks    Comment: socially   Drug use: No   Sexual  activity: Yes    Birth control/protection: Condom  Other Topics Concern   Not on file  Social History Narrative   Not on file   Social Determinants of Health   Financial Resource Strain:    Difficulty of Paying Living Expenses: Not on file  Food Insecurity:    Worried About Charity fundraiser in the Last Year: Not on file   [employer] of Food in the Last Year: Not on file  Transportation Needs:    Lack of Transportation (Medical): Not on file   Lack of Transportation (Non-Medical): Not on file  Physical Activity:    Days of Exercise per Week: Not on file   Minutes of Exercise per Session: Not on file  Stress:    Feeling of Stress : Not on file  Social Connections:    Frequency of Communication with Friends and Family: Not on file   Frequency of Social Gatherings with Friends and Family: Not on file   Attends Religious Services: Not on file   Active Member of Clubs or Organizations: Not on file   Attends Archivist Meetings: Not on file   Marital Status: Not on file  Intimate Partner Violence:    Fear of Current or Ex-Partner: Not on file   Emotionally Abused: Not on file   Physically Abused: Not on file   Sexually Abused: Not on file  Current Outpatient Medications:    SUMAtriptan (IMITREX) 50 MG tablet, Take 50 mg by mouth every 2 (two) hours as needed for migraine. May repeat in 2 hours if headache persists or recurs., Disp: , Rfl:    valACYclovir (VALTREX) 500 MG tablet, Take 1 tablet (500 mg total) by mouth daily. Or BID for 3 days prn sx, Disp: 90 tablet, Rfl: 1     ROS:  Review of Systems BREAST: No symptoms   Objective: There were no vitals taken for this visit.   OBGyn Exam  Results: No results found for this or any previous visit (from the past 24 hour(s)).  Assessment/Plan: No diagnosis found.  No orders of the defined types were placed in this encounter.            GYN counsel {counseling:16159}      F/U  No follow-ups on file.  Wania Longstreth B. Edee Nifong, PA-C 03/19/2020 8:26 AM

## 2020-03-19 NOTE — Telephone Encounter (Signed)
Patient has [employer] and cancelling her appointment for today and will be finding a new provider in network.  She is asking if she could get a refill on the Valtrex until she can get in with a new provider.  [facility].

## 2021-06-10 ENCOUNTER — Ambulatory Visit (INDEPENDENT_AMBULATORY_CARE_PROVIDER_SITE_OTHER): Payer: 59 | Admitting: Obstetrics and Gynecology

## 2021-06-10 ENCOUNTER — Other Ambulatory Visit: Payer: Self-pay

## 2021-06-10 ENCOUNTER — Other Ambulatory Visit (HOSPITAL_COMMUNITY)
Admission: RE | Admit: 2021-06-10 | Discharge: 2021-06-10 | Disposition: A | Discharge status: Home / Self Care | Payer: Medicaid Other | Source: Ambulatory Visit | Attending: Obstetrics and Gynecology | Admitting: Obstetrics and Gynecology

## 2021-06-10 ENCOUNTER — Encounter: Payer: Self-pay | Admitting: Obstetrics and Gynecology

## 2021-06-10 VITALS — BP 100/70 | Ht 61.0 in | Wt 104.0 lb

## 2021-06-10 DIAGNOSIS — Z124 Encounter for screening for malignant neoplasm of cervix: Secondary | ICD-10-CM

## 2021-06-10 DIAGNOSIS — N6321 Unspecified lump in the left breast, upper outer quadrant: Secondary | ICD-10-CM

## 2021-06-10 DIAGNOSIS — Z113 Encounter for screening for infections with a predominantly sexual mode of transmission: Secondary | ICD-10-CM | POA: Insufficient documentation

## 2021-06-10 DIAGNOSIS — D242 Benign neoplasm of left breast: Secondary | ICD-10-CM

## 2021-06-10 DIAGNOSIS — N946 Dysmenorrhea, unspecified: Secondary | ICD-10-CM | POA: Diagnosis not present

## 2021-06-10 DIAGNOSIS — A6004 Herpesviral vulvovaginitis: Secondary | ICD-10-CM

## 2021-06-10 DIAGNOSIS — N760 Acute vaginitis: Secondary | ICD-10-CM | POA: Diagnosis not present

## 2021-06-10 DIAGNOSIS — Z01419 Encounter for gynecological examination (general) (routine) without abnormal findings: Secondary | ICD-10-CM | POA: Diagnosis not present

## 2021-06-10 MED ORDER — IBUPROFEN 800 MG PO TABS: 800.0000 mg | ORAL_TABLET | Freq: Three times a day (TID) | ORAL | 1 refills | Status: DC | PRN

## 2021-06-10 MED ORDER — VALACYCLOVIR HCL 500 MG PO TABS: 500.0000 mg | ORAL_TABLET | Freq: Two times a day (BID) | ORAL | 1 refills | Status: AC

## 2021-06-10 NOTE — Progress Notes (Signed)
PCP:  Juline Patch, MD   Chief Complaint  Patient presents with   Gynecologic Exam    Periods are now more reg, severe cramping     HPI:      [name] is a 25 y.o. G0P0000 whose LMP was Patient's last menstrual period was 06/07/2021 (exact date)., presents today for her annual examination.  Her menses are regular every 28-30 days, lasting 5 days, mod flow.  Dysmenorrhea moderate to severe, having to miss activities for 1-2 days. Has tried NSAIDs without relief. Stopped depo over a year ago, now finally getting regular periods again.  She does not have intermenstrual bleeding. Occas has non-menstrual pelvic pain.  Sex activity: single partner, contraception - condoms sometimes. Prefers not to be on [facility]. Did OCPs in past, depo with wt gain, nexplanon with BTB. Hx of migraines with aura. 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 No recent outbreaks.   There is no FH of breast cancer. There is no FH of ovarian cancer. The patient does do self-breast exams. 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 Sankar. Notices tenderness sometimes inferior to excision scar, no masses. No caffeine use.   Tobacco use: The patient denies current or previous tobacco use. Alcohol use: none Daily marijuana use.  Exercise: moderately active  She does not get adequate calcium and Vitamin D in her diet.  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/19/2020   Right ovarian cyst 01/17/2019    Past Surgical History:  Procedure Laterality Date   BREAST LUMPECTOMY Left 03/01/2015   Procedure: BREAST LUMPECTOMY/ MASS EXCISION X 2;  Surgeon: Christene Lye, MD;  Location: [facility] ORS;  Service: General;  Laterality: Left;    Family History  Problem Relation Age of Onset    Hypertension Mother    Diabetes Maternal Grandmother     Social History   Socioeconomic History   Marital status: Single    Spouse name: Not on file   Number of children: Not on file   Years of education: Not on file   Highest education level: Not on file  Occupational History   Not on file  Tobacco Use   Smoking status: Never   Smokeless tobacco: Never  Vaping Use   Vaping Use: Never used  Substance and Sexual Activity   Alcohol use: Yes    Alcohol/week: 0.0 standard drinks    Comment: socially   Drug use: No   Sexual activity: Yes    Birth control/protection: Condom  Other Topics Concern   Not on file  Social History Narrative   Not on file   Social Determinants of Health   Financial Resource Strain: Not on file  Food Insecurity: Not on file  Transportation Needs: Not on file  Physical Activity: Not on file  Stress: Not on file  Social Connections: Not on file  Intimate Partner Violence: Not on file     Current Outpatient Medications:    ibuprofen (ADVIL) 800 MG tablet, Take 1 tablet (800 mg total) by mouth every 8 (eight) hours as needed., Disp: 30 tablet, Rfl: 1   SUMAtriptan (IMITREX) 50 MG tablet, Take 50 mg by mouth every 2 (two) hours as needed for migraine. May repeat in 2 hours if headache  persists or recurs., Disp: , Rfl:    valACYclovir (VALTREX) 500 MG tablet, Take 1 tablet (500 mg total) by mouth 2 (two) times daily for 3 days. Prn sx, Disp: 30 tablet, Rfl: 1     ROS:  Review of Systems  Constitutional:  Negative for fatigue, fever and unexpected weight change.  Respiratory:  Negative for cough, shortness of breath and wheezing.   Cardiovascular:  Negative for chest pain, palpitations and leg swelling.  Gastrointestinal:  Negative for blood in stool, constipation, diarrhea, nausea and vomiting.  Endocrine: Negative for cold intolerance, heat intolerance and polyuria.  Genitourinary:  Negative for dyspareunia, dysuria, flank pain, frequency,  genital sores, hematuria, menstrual problem, pelvic pain, urgency, vaginal bleeding, vaginal discharge and vaginal pain.  Musculoskeletal:  Negative for back pain, joint swelling and myalgias.  Skin:  Negative for rash.  Neurological:  Negative for dizziness, syncope, light-headedness, numbness and headaches.  Hematological:  Negative for adenopathy.  Psychiatric/Behavioral:  Negative for agitation, confusion, sleep disturbance and suicidal ideas. The patient is not nervous/anxious.   BREAST: tenderness   Objective: BP 100/70    Ht 5\' 1"  (1.549 m)    Wt 104 lb (47.2 kg)    LMP 06/07/2021 (Exact Date)    BMI 19.65 kg/m    Physical Exam Constitutional:      Appearance: She is well-developed.  Genitourinary:     Vulva normal.     Right Labia: No rash, tenderness or lesions.    Left Labia: No tenderness, lesions or rash.    No vaginal discharge, erythema or tenderness.      Right Adnexa: not tender and no mass present.    Left Adnexa: not tender and no mass present.    No cervical friability or polyp.     Uterus is not enlarged or tender.  Breasts:    Right: No mass, nipple discharge, skin change or tenderness.     Left: No mass, nipple discharge, skin change or tenderness.  Neck:     Thyroid: No thyromegaly.  Cardiovascular:     Rate and Rhythm: Normal rate and regular rhythm.     Heart sounds: Normal heart sounds. No murmur heard. Pulmonary:     Effort: Pulmonary effort is normal.     Breath sounds: Normal breath sounds.  Chest:    Abdominal:     Palpations: Abdomen is soft.     Tenderness: There is no abdominal tenderness. There is no guarding or rebound.  Musculoskeletal:        General: Normal range of motion.     Cervical back: Normal range of motion.  Lymphadenopathy:     Cervical: No cervical adenopathy.  Neurological:     General: No focal deficit present.     Mental Status: She is alert and oriented to person, place, and time.     Cranial Nerves: No cranial  nerve deficit.  Skin:    General: Skin is warm and dry.  Psychiatric:        Mood and Affect: Mood normal.        Behavior: Behavior normal.        Thought Content: Thought content normal.        Judgment: Judgment normal.  Vitals reviewed.    Assessment/Plan: Encounter for annual routine gynecological examination  Cervical cancer screening - Plan: Cytology - PAP  Screening for STD (sexually transmitted disease) - Plan: Cytology - PAP  Dysmenorrhea - Plan: ibuprofen (ADVIL) 800 MG tablet; discussed prog only BC options  and Rx NSAIDs. Will try aleve 2-3 days before dysmen and then Rx motrin 800 mg TID with period. F/u if sx persist.   Herpes simplex vulvovaginitis - Plan: valACYclovir (VALTREX) 500 MG tablet; Rx RF valtrex for prn use  Breast fibroadenoma in female, left - Plan: US BREAST LTD UNI LEFT INC AXILLA  Mass of upper outer quadrant of left breast - Plan: US BREAST LTD UNI LEFT INC AXILLA; question new mass vs scar tissue/prominent tissue. Pt to sched breast u/s for further eval. Will f/u with results.   Meds ordered this encounter  Medications   valACYclovir (VALTREX) 500 MG tablet    Sig: Take 1 tablet (500 mg total) by mouth 2 (two) times daily for 3 days. Prn sx    Dispense:  30 tablet    Refill:  1    Order Specific Question:   Supervising Provider    Answer:   Gae Dry [[id number]]   ibuprofen (ADVIL) 800 MG tablet    Sig: Take 1 tablet (800 mg total) by mouth every 8 (eight) hours as needed.    Dispense:  30 tablet    Refill:  1    Order Specific Question:   Supervising Provider    Answer:   Gae Dry [[id number]]              GYN counsel breast self exam, family planning choices, adequate intake of calcium and vitamin D, diet and exercise     F/U  Return in about 1 year (around 06/10/2022).  Treavon Castilleja B. Azaylea Maves, PA-C 06/10/2021 4:11 PM

## 2021-06-13 LAB — CYTOLOGY - PAP
Chlamydia: NEGATIVE
Comment: NEGATIVE
Comment: NORMAL
Diagnosis: NEGATIVE
Neisseria Gonorrhea: NEGATIVE

## 2021-06-25 ENCOUNTER — Inpatient Hospital Stay: Admission: RE | Admit: 2021-06-25 | Payer: Medicaid Other | Source: Ambulatory Visit

## 2021-07-29 ENCOUNTER — Other Ambulatory Visit: Payer: Self-pay

## 2021-07-29 ENCOUNTER — Emergency Department: Payer: 59

## 2021-07-29 ENCOUNTER — Emergency Department
Admission: EM | Admit: 2021-07-29 | Discharge: 2021-07-29 | Disposition: A | Discharge status: Home / Self Care | Payer: 59 | Attending: Emergency Medicine | Admitting: Emergency Medicine

## 2021-07-29 ENCOUNTER — Telehealth: Payer: Self-pay

## 2021-07-29 DIAGNOSIS — N9489 Other specified conditions associated with female genital organs and menstrual cycle: Secondary | ICD-10-CM | POA: Insufficient documentation

## 2021-07-29 DIAGNOSIS — Z3A01 Less than 8 weeks gestation of pregnancy: Secondary | ICD-10-CM | POA: Insufficient documentation

## 2021-07-29 DIAGNOSIS — O3680X0 Pregnancy with inconclusive fetal viability, not applicable or unspecified: Secondary | ICD-10-CM

## 2021-07-29 DIAGNOSIS — O209 Hemorrhage in early pregnancy, unspecified: Secondary | ICD-10-CM | POA: Diagnosis present

## 2021-07-29 DIAGNOSIS — R1031 Right lower quadrant pain: Secondary | ICD-10-CM | POA: Diagnosis not present

## 2021-07-29 LAB — CBC WITH DIFFERENTIAL/PLATELET
Abs Immature Granulocytes: 0.04 10*3/uL (ref 0.00–0.07)
Basophils Absolute: 0.1 10*3/uL (ref 0.0–0.1)
Basophils Relative: 1 %
Eosinophils Absolute: 0.1 10*3/uL (ref 0.0–0.5)
Eosinophils Relative: 1 %
HCT: 38.4 % (ref 36.0–46.0)
Hemoglobin: 13.6 g/dL (ref 12.0–15.0)
Immature Granulocytes: 0 %
Lymphocytes Relative: 20 %
Lymphs Abs: 2 10*3/uL (ref 0.7–4.0)
MCH: 35.2 pg — ABNORMAL HIGH (ref 26.0–34.0)
MCHC: 35.4 g/dL (ref 30.0–36.0)
MCV: 99.5 fL (ref 80.0–100.0)
Monocytes Absolute: 0.8 10*3/uL (ref 0.1–1.0)
Monocytes Relative: 8 %
Neutro Abs: 7.1 10*3/uL (ref 1.7–7.7)
Neutrophils Relative %: 70 %
Platelets: 229 10*3/uL (ref 150–400)
RBC: 3.86 MIL/uL — ABNORMAL LOW (ref 3.87–5.11)
RDW: 12.2 % (ref 11.5–15.5)
WBC: 10 10*3/uL (ref 4.0–10.5)
nRBC: 0 % (ref 0.0–0.2)

## 2021-07-29 LAB — BASIC METABOLIC PANEL
Anion gap: 7 (ref 5–15)
BUN: 18 mg/dL (ref 6–20)
CO2: 24 mmol/L (ref 22–32)
Calcium: 9.1 mg/dL (ref 8.9–10.3)
Chloride: 104 mmol/L (ref 98–111)
Creatinine, Ser: 0.79 mg/dL (ref 0.44–1.00)
GFR, Estimated: >60 mL/min (ref >60)
Glucose, Bld: 106 mg/dL — ABNORMAL HIGH (ref 70–99)
Potassium: 3.5 mmol/L (ref 3.5–5.1)
Sodium: 135 mmol/L (ref 135–145)

## 2021-07-29 LAB — POC URINE PREG, ED: Preg Test, Ur: POSITIVE — AB

## 2021-07-29 LAB — HCG, QUANTITATIVE, PREGNANCY: hCG, Beta Chain, Quant, S: 3654 m[IU]/mL — ABNORMAL HIGH (ref <5)

## 2021-07-29 LAB — ABO/RH: ABO/RH(D): A POS

## 2021-07-29 MED ORDER — ACETAMINOPHEN 500 MG PO TABS
1000.0000 mg | ORAL_TABLET | Freq: Once | ORAL | Status: AC
  Administered 2021-07-29: 1000 mg via ORAL
  Filled 2021-07-29: qty 2

## 2021-07-29 NOTE — ED Provider Notes (Addendum)
[facility] Provider Note    Event Date/Time   First MD Initiated Contact with Patient 07/29/21 1109     (approximate)   History   Vaginal Bleeding   HPI  Naveh KENDRICK REMIGIO is a 26 y.o. female who presents with vaginal bleeding in first trimester pregnancy.  Patient took a positive pregnancy test about 2 weeks ago.  Last menstrual period was December 9.  Yesterday had some pink spotting and then today some heavier bleeding.  Passed about quarter size clot.  She has some associated right lower quadrant pain.  Pain is constant but then intermittently worsens.  Denies urinary symptoms.  No fevers chills no diarrhea or constipation.    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      Excl. in [place]?     Most recent vital signs: Vitals:   07/29/21 1121 07/29/21 1130  BP: 119/77 111/64  Pulse: 88   Resp: 17   Temp:    SpO2: 100%      General: Awake, no distress.  CV:  Good peripheral perfusion.  Resp:  Normal effort.  Abd:  No distention.  Mild tenderness to palpation in the right lower quadrant and epigastric region Neuro:             Awake, Alert, Oriented x 3  Other:     ED Results / Procedures / Treatments  Labs (all labs ordered are listed, but only abnormal results are displayed) Labs Reviewed  CBC WITH DIFFERENTIAL/PLATELET -  Abnormal; Notable for the following components:      Result Value   RBC 3.86 (*)    MCH 35.2 (*)    All other components within normal limits  BASIC METABOLIC PANEL - Abnormal; Notable for the following components:   Glucose, Bld 106 (*)    All other components within normal limits  HCG, QUANTITATIVE, PREGNANCY - Abnormal; Notable for the following components:   hCG, Beta Chain, Quant, S 3,654 (*)    All other components within normal limits  POC URINE PREG, ED - Abnormal; Notable for the following components:   Preg Test, Ur POSITIVE (*)    All other components within normal limits  URINALYSIS, COMPLETE (UACMP) WITH MICROSCOPIC  ABO/RH     EKG     RADIOLOGY Reviewed the first trimester ultrasound which shows a intrauterine gestational sac, no yolk sac, no signs of ectopic    PROCEDURES:    MEDICATIONS ORDERED IN ED: Medications  acetaminophen (TYLENOL) tablet 1,000 mg (  1,000 mg Oral Given 07/29/21 1312)     IMPRESSION / MDM / ASSESSMENT AND PLAN / ED COURSE  I reviewed the triage vital signs and the nursing notes.                              Differential diagnosis includes, but is not limited to, threatened miscarriage, ectopic pregnancy, incomplete miscarriage, appendicitis  Patient is a 26 year old female who presents with vaginal bleeding and abdominal pain in the setting of first semester pregnancy.  LMP was about 6 weeks ago.  Started having some light spotting yesterday that became heavier today.  This is associate with right lower quadrant pain.  No other GI symptoms.  On exam patient appears well.  Abdomen is overall benign but she does have some mild tenderness in the right lower quadrant.  Patient's first semester ultrasound shows a intrauterine gestational sac without a yolk sac but no secondary signs of ectopic including significant free fluid or adnexal masses. On repeat evaluation patient says that pain is primarily suprapubic and also in the left lower  quadrant.  On repeat abdominal exam she really does not have significant tenderness in the right lower quadrant, she appears comfortable.  Tolerating p.o.  I suspect that this is just an early pregnancy.  Suspicion for appendicitis is low.  Patient was counseled about the pregnancy being of indeterminate location at this time and the need to follow-up in 48 hours for repeat beta hCG and likely ultrasound.  I advised that she call [facility] today to schedule an appointment.  Also discussed return precautions for significant bleeding or worsening pain.  Patient understood. Clinical Course as of 07/29/21 1447  Tue Jul 29, 2021  1118 Preg Test, Ur(!): POSITIVE [KM]  6440 ABO/RH(D): A POS Performed at [facility], [address] [place], [place] [address]  [KM]    Clinical Course User Index [KM] Rada Hay, MD     FINAL CLINICAL IMPRESSION(S) / ED DIAGNOSES   Final diagnoses:  Pregnancy of unknown anatomic location     Rx / DC Orders   ED Discharge Orders     None        Note:  This document was prepared using Dragon voice recognition software and may include unintentional dictation errors.   Rada Hay, MD 07/29/21 1259    Rada Hay, MD 07/29/21 1447

## 2021-07-29 NOTE — ED Notes (Signed)
Lab contacted to add on ABO/Rh, per tech nicole, will be able to do so.

## 2021-07-29 NOTE — Telephone Encounter (Signed)
Pt calling; recently found out she is preg; has some light pink bleeding; it stopped; now it's red c clots; should she be concerned?  [phone]  pt is currently in ER; adv that is where she should be as they can do u/s and we cannot; will probably get a better peace of mind there than here. Pt appreciative.

## 2021-07-29 NOTE — ED Triage Notes (Signed)
Pt states she just recently found out she is pregnant, having lower abd pain and bleeding since last night

## 2021-07-29 NOTE — Discharge Instructions (Addendum)
Your ultrasound today shows that you have a gestational sac in your uterus but does not definitively show that you have an intrauterine pregnancy.  It is possible that you are just very early on in pregnancy but because we do not see a pregnancy in the uterus we cannot say for sure that you do not have an ectopic pregnancy.  It is important to have a repeat pregnancy hormone test done in 48 hours as well as blood possible repeat ultrasound.  Please call your OB/GYN today to schedule an appointment.  If your pain is worsening or you are bleeding and having to change her pad more than once every 2 hours, please return to the emergency department.

## 2021-07-30 ENCOUNTER — Ambulatory Visit (INDEPENDENT_AMBULATORY_CARE_PROVIDER_SITE_OTHER): Payer: 59 | Admitting: Licensed Practical Nurse

## 2021-07-30 ENCOUNTER — Encounter: Payer: Self-pay | Admitting: Licensed Practical Nurse

## 2021-07-30 ENCOUNTER — Ambulatory Visit
Admission: RE | Admit: 2021-07-30 | Discharge: 2021-07-30 | Disposition: A | Discharge status: Home / Self Care | Payer: 59 | Source: Ambulatory Visit | Attending: Obstetrics and Gynecology | Admitting: Obstetrics and Gynecology

## 2021-07-30 VITALS — BP 124/62 | Ht 61.0 in | Wt 110.0 lb

## 2021-07-30 DIAGNOSIS — O200 Threatened abortion: Secondary | ICD-10-CM | POA: Diagnosis not present

## 2021-07-30 DIAGNOSIS — N6321 Unspecified lump in the left breast, upper outer quadrant: Secondary | ICD-10-CM | POA: Diagnosis present

## 2021-07-30 DIAGNOSIS — D242 Benign neoplasm of left breast: Secondary | ICD-10-CM | POA: Diagnosis present

## 2021-07-30 NOTE — Progress Notes (Signed)
S) Here for ED follow up.  Was seen yesterday in the ED for vaginal bleeding at 5wks.  Yesterday she was having heavier bleeding with some clots-not able to say how big just that her bleeding was "stringy".  She had some cramping. Today she is having very light red bleeding, no cramping.   G1P0 Med hx of Migraines, HSV   Outpatient Encounter Medications as of 07/30/2021  Medication Sig   SUMAtriptan (IMITREX) 50 MG tablet Take 50 mg by mouth every 2 (two) hours as needed for migraine. May repeat in 2 hours if headache persists or recurs.   No facility-administered encounter medications on file as of 07/30/2021.     O)BP 124/62    Ht 5\' 1"  (1.549 m)    Wt 110 lb (49.9 kg)    LMP 06/07/2021 (Exact Date)    BMI 20.78 kg/m    [place] results: Intrauterine gestational sac is present. Calculated gestational age is 5 weeks and 3 days.  Beta HCG on 07/29/21: 3,654 Blood type A pos  GEN: well groomed, NAD,  PE declines exam today  A) Threatened abortion  P) Reviewed this is most likely a miscarriage, anticipatory guidance given. Will return tomorrow for repeat BHCG.  -Repeat US ordered   -will plan fu based on symptoms, labs and [place] results -Ok to return in 2 weeks for fu visit.   -bleeding precautions given  Roberto Scales, [place], [facility]  07/30/21  4:43 PM

## 2021-07-31 ENCOUNTER — Other Ambulatory Visit: Payer: Self-pay | Admitting: Obstetrics and Gynecology

## 2021-07-31 ENCOUNTER — Other Ambulatory Visit: Payer: Self-pay

## 2021-07-31 ENCOUNTER — Other Ambulatory Visit: Payer: 59

## 2021-07-31 DIAGNOSIS — N6322 Unspecified lump in the left breast, upper inner quadrant: Secondary | ICD-10-CM

## 2021-07-31 DIAGNOSIS — O200 Threatened abortion: Secondary | ICD-10-CM

## 2021-07-31 DIAGNOSIS — D242 Benign neoplasm of left breast: Secondary | ICD-10-CM

## 2021-07-31 NOTE — Progress Notes (Signed)
6 mo f/u u/s LT breast

## 2021-08-01 ENCOUNTER — Other Ambulatory Visit: Payer: Self-pay | Admitting: Licensed Practical Nurse

## 2021-08-01 ENCOUNTER — Encounter: Payer: Self-pay | Admitting: Licensed Practical Nurse

## 2021-08-01 DIAGNOSIS — O039 Complete or unspecified spontaneous abortion without complication: Secondary | ICD-10-CM

## 2021-08-01 LAB — BETA HCG QUANT (REF LAB): hCG Quant: 654 m[IU]/mL

## 2021-08-01 NOTE — Progress Notes (Signed)
TC to [facility], Maggie reports her bleeding picked up after our appointment, she saw a few small clots.  Now the bleeding is a light amount of dark red blood.  Denies any cramping. Reviewed labs, given the significant drop she probably has miscarried.  Offered 1) keep [place] appointment and repeat labs in 2 weeks 2) labs in 2 weeks only +/- office visit.  Gyanna prefers to do a lab only visit in 2 weeks.  BHCG order placed. Roberto Scales, CNM  Mosetta Pigeon, [facility]  08/01/21  3:44 PM

## 2021-08-08 ENCOUNTER — Ambulatory Visit: Payer: 59

## 2021-08-11 ENCOUNTER — Encounter: Payer: 59 | Admitting: Licensed Practical Nurse

## 2021-08-22 ENCOUNTER — Ambulatory Visit (INDEPENDENT_AMBULATORY_CARE_PROVIDER_SITE_OTHER): Payer: 59 | Admitting: Licensed Practical Nurse

## 2021-08-22 ENCOUNTER — Other Ambulatory Visit: Payer: Self-pay

## 2021-08-22 VITALS — BP 102/60 | Ht 61.0 in | Wt 110.0 lb

## 2021-08-22 DIAGNOSIS — F32A Depression, unspecified: Secondary | ICD-10-CM | POA: Diagnosis not present

## 2021-08-22 DIAGNOSIS — Z5189 Encounter for other specified aftercare: Secondary | ICD-10-CM | POA: Diagnosis not present

## 2021-08-22 DIAGNOSIS — O039 Complete or unspecified spontaneous abortion without complication: Secondary | ICD-10-CM

## 2021-08-22 NOTE — Patient Instructions (Signed)
Herbs for mood: Lemon Balm Passionflower St John's wart Skull cap, good for sleep

## 2021-08-22 NOTE — Progress Notes (Signed)
S) Here for SAB follow up.  Feels she passed everything after our phone call on 2/3.  Bleeding has stopped. Emotionally [name] seems to be struggling some, she has tearful episodes and is easily irritable.  Admits she is probably depressed, has struggled with it in the past.  Feels that the depression is related to the miscarriage and "life in general" as she is going through a lot of transition now.  Declines need for medication.  Has considered therapy but has not called anyone yet.   States she is not sure about another pregnancy.  At times she feels she wants to be pregnant "but for the wrong reasons".  Declines contraception today. She does use condoms occasionally.  They have not had sex since the miscarriage, [name] is not interested in sex and "does not want to be touched".   O) BP 102/60    Ht 5\' 1"  (1.549 m)    Wt 110 lb (49.9 kg)    BMI 20.78 kg/m  GEN: well groomed, NAD,  Lungs: no work of breathing  PHQ- 12 GAD 7- 8  A) complete Abortion Depression  P) -BHCG today, will follow to less than 5 -Discussed treatment options for Depression, encouraged therapy or support group, list of safe herbs given. -RTC if contraception is desired -RTC or see PCP if treatment for Depression is desired -Annual due in December  Natasha Suarez, [place], [facility]  08/22/21  3:14 PM

## 2021-08-23 LAB — BETA HCG QUANT (REF LAB): hCG Quant: 3 m[IU]/mL

## 2021-08-25 ENCOUNTER — Encounter: Payer: Self-pay | Admitting: Licensed Practical Nurse

## 2021-12-19 IMAGING — DX DG CHEST 1V PORT
1 series · 1 of 1 positions shown · non-contrast
Comparison: None.

CLINICAL DATA: Cough and congestion

EXAM:
PORTABLE CHEST 1 VIEW

[chest ap]
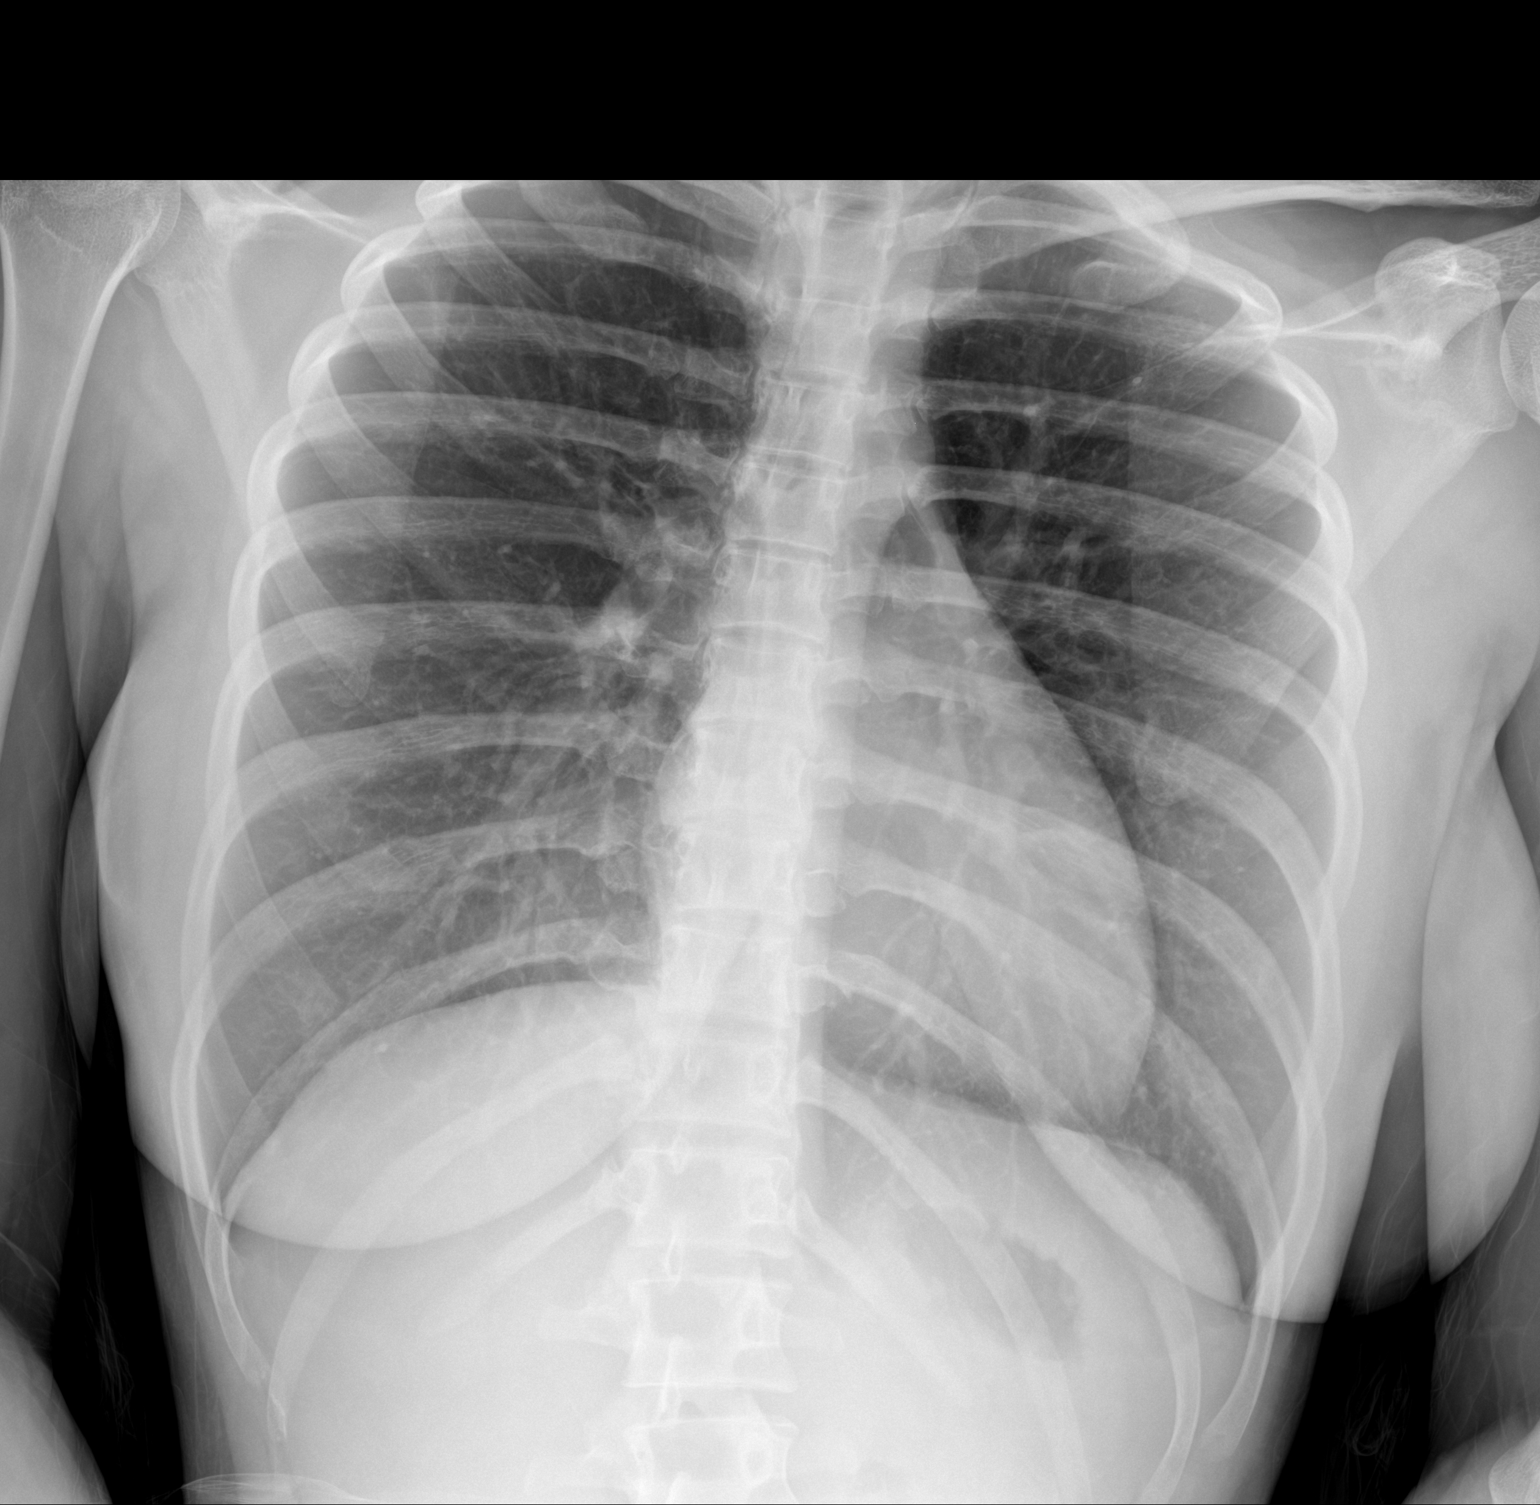

[1 of 1 positions shown; findings below may reference images not displayed]

FINDINGS: The heart size and mediastinal contours are within normal limits.
Both lungs are clear. The visualized skeletal structures are
unremarkable.
IMPRESSION: No active disease.

## 2022-02-19 ENCOUNTER — Other Ambulatory Visit: Payer: 59

## 2022-02-20 ENCOUNTER — Other Ambulatory Visit: Payer: 59

## 2022-02-20 ENCOUNTER — Ambulatory Visit (LOCAL_COMMUNITY_HEALTH_CENTER): Payer: Self-pay

## 2022-02-20 DIAGNOSIS — Z111 Encounter for screening for respiratory tuberculosis: Secondary | ICD-10-CM

## 2022-02-23 ENCOUNTER — Ambulatory Visit (LOCAL_COMMUNITY_HEALTH_CENTER): Payer: Self-pay

## 2022-02-23 DIAGNOSIS — Z111 Encounter for screening for respiratory tuberculosis: Secondary | ICD-10-CM

## 2022-02-23 LAB — TB SKIN TEST
Induration: 0 mm
TB Skin Test: NEGATIVE

## 2022-02-23 NOTE — Progress Notes (Signed)
  Subjective:    Chief Complaint  Patient presents with  . Teacher PE    Natasha Suarez is a 26 y.o., female  who presents to clinic for pre-employment physical No current complaints.  The following portions of the patient's history were reviewed and updated as appropriate: allergies, PMHx, current medications, past social history, and problem list. See scanned documents.     Objective:     Vitals:   02/23/22 1322  BP: 95/59  Pulse: 62  Temp: 36.6 C (97.8 F)  SpO2: 99%  Weight: 49.4 kg (109 lb)  Height: 154.9 cm (5' 1)  PainSc: 0-No pain    General appearance: alert, appears stated age, and cooperative Head: Normocephalic, without obvious abnormality, atraumatic Eyes: conjunctivae and sclerae normal, PERRL Ears: normal TM's and external ear canals both ears Throat: lips, mucosa, and tongue normal; teeth and gums normal; posterior pharynx without lesions or exudates Neck:  No cervical adenopathy,  supple, symmetrical, trachea midline Lungs: clear to auscultation bilaterally Heart: S1, S2 normal, RRR and no murmur,  Abdomen: soft, non-tender Extremities: moves with ease  Skin: Warm and dry Neurologic: Mental status: Alert Gait: Normal       Assessment and Plan:      1. Physical exam, pre-employment  Cleared.    Attestation Statement:   I personally performed the service, non-incident to. (WP)   ASHLEY BRIDGETTE POISSON, PA

## 2022-02-24 ENCOUNTER — Other Ambulatory Visit: Payer: 59

## 2022-10-29 ENCOUNTER — Ambulatory Visit (INDEPENDENT_AMBULATORY_CARE_PROVIDER_SITE_OTHER): Payer: 59

## 2022-10-29 VITALS — BP 113/74 | HR 74 | Temp 97.9°F | Ht 61.0 in | Wt 103.9 lb

## 2022-10-29 DIAGNOSIS — R300 Dysuria: Secondary | ICD-10-CM

## 2022-10-29 LAB — POCT URINALYSIS DIPSTICK
Bilirubin, UA: NEGATIVE
Glucose, UA: NEGATIVE
Ketones, UA: POSITIVE
Nitrite, UA: NEGATIVE
Protein, UA: POSITIVE — AB
Spec Grav, UA: 1.015 (ref 1.010–1.025)
Urobilinogen, UA: 0.2 E.U./dL
pH, UA: 6 (ref 5.0–8.0)

## 2022-10-29 MED ORDER — NITROFURANTOIN MONOHYD MACRO 100 MG PO CAPS: 100.0000 mg | ORAL_CAPSULE | Freq: Two times a day (BID) | ORAL | 0 refills | Status: DC

## 2022-10-29 NOTE — Progress Notes (Signed)
    NURSE VISIT NOTE  Subjective:    Patient ID: ANONA GIOVANNINI, female    DOB: [birth year], 27 y.o.   MRN: [id number]       HPI  Patient is a 27 y.o. G51P0010 female who presents for dysuria, urinary frequency, and abdominal pain for 4 days.  Patient denies hematuria, flank pain, genital rash, genital irritation, and vaginal discharge.  Patient does not have a history of recurrent UTI.  Patient does not have a history of pyelonephritis.    Objective:    BP 113/74   Pulse 74   Temp 97.9 F (36.6 C) (Oral)   Ht 5\' 1"  (1.549 m)   Wt 103 lb 14.4 oz (47.1 kg)   BMI 19.63 kg/m    Lab Review  Results for orders placed or performed in visit on 10/29/22  POCT Urinalysis Dipstick  Result Value Ref Range   Color, UA     Clarity, UA     Glucose, UA Negative Negative   Bilirubin, UA Negative    Ketones, UA Positive    Spec Grav, UA 1.015 1.010 - 1.025   Blood, UA Trace    pH, UA 6.0 5.0 - 8.0   Protein, UA Positive (A) Negative   Urobilinogen, UA 0.2 0.2 or 1.0 E.U./dL   Nitrite, UA Negative    Leukocytes, UA Small (1+) (A) Negative   Appearance     Odor      Assessment:   1. Dysuria      Plan:   Urine Culture Sent. Treatment  Macrobid 100 mg PO BID for 7 days. Maintain adequate hydration.  Follow up if symptoms worsen or fail to improve as anticipated, and as needed.    Hildred Laser, MD  [facility]

## 2022-10-29 NOTE — Patient Instructions (Signed)
Urinary Tract Infection, Adult A urinary tract infection (UTI) is an infection of any part of the urinary tract. The urinary tract includes: The kidneys. The ureters. The bladder. The urethra. These organs make, store, and get rid of pee (urine) in the body. What are the causes? This infection is caused by germs (bacteria) in your genital area. These germs grow and cause swelling (inflammation) of your urinary tract. What increases the risk? The following factors may make you more likely to develop this condition: Using a small, thin tube (catheter) to drain pee. Not being able to control when you pee or poop (incontinence). Being female. If you are female, these things can increase the risk: Using these methods to prevent pregnancy: A medicine that kills sperm (spermicide). A device that blocks sperm (diaphragm). Having low levels of a female hormone (estrogen). Being pregnant. You are more likely to develop this condition if: You have genes that add to your risk. You are sexually active. You take antibiotic medicines. You have trouble peeing because of: A prostate that is bigger than normal, if you are female. A blockage in the part of your body that drains pee from the bladder. A kidney stone. A nerve condition that affects your bladder. Not getting enough to drink. Not peeing often enough. You have other conditions, such as: Diabetes. A weak disease-fighting system (immune system). Sickle cell disease. Gout. Injury of the spine. What are the signs or symptoms? Symptoms of this condition include: Needing to pee right away. Peeing small amounts often. Pain or burning when peeing. Blood in the pee. Pee that smells bad or not like normal. Trouble peeing. Pee that is cloudy. Fluid coming from the vagina, if you are female. Pain in the belly or lower back. Other symptoms include: Vomiting. Not feeling hungry. Feeling mixed up (confused). This may be the first symptom in  older adults. Being tired and grouchy (irritable). A fever. Watery poop (diarrhea). How is this treated? Taking antibiotic medicine. Taking other medicines. Drinking enough water. In some cases, you may need to see a specialist. Follow these instructions at home:  Medicines Take over-the-counter and prescription medicines only as told by your doctor. If you were prescribed an antibiotic medicine, take it as told by your doctor. Do not stop taking it even if you start to feel better. General instructions Make sure you: Pee until your bladder is empty. Do not hold pee for a long time. Empty your bladder after sex. Wipe from front to back after peeing or pooping if you are a female. Use each tissue one time when you wipe. Drink enough fluid to keep your pee pale yellow. Keep all follow-up visits. Contact a doctor if: You do not get better after 1-2 days. Your symptoms go away and then come back. Get help right away if: You have very bad back pain. You have very bad pain in your lower belly. You have a fever. You have chills. You feeling like you will vomit or you vomit. Summary A urinary tract infection (UTI) is an infection of any part of the urinary tract. This condition is caused by germs in your genital area. There are many risk factors for a UTI. Treatment includes antibiotic medicines. Drink enough fluid to keep your pee pale yellow. This information is not intended to replace advice given to you by your health care provider. Make sure you discuss any questions you have with your health care provider. Document Revised: 01/26/2020 Document Reviewed: 01/26/2020 Elsevier Patient Education    2023 [employer]  

## 2022-11-04 LAB — URINE CULTURE

## 2023-03-10 ENCOUNTER — Encounter: Payer: Self-pay | Admitting: Licensed Practical Nurse

## 2023-03-10 ENCOUNTER — Ambulatory Visit (INDEPENDENT_AMBULATORY_CARE_PROVIDER_SITE_OTHER): Payer: Medicaid Other | Admitting: Licensed Practical Nurse

## 2023-03-10 ENCOUNTER — Other Ambulatory Visit (HOSPITAL_COMMUNITY)
Admission: RE | Admit: 2023-03-10 | Discharge: 2023-03-10 | Disposition: A | Discharge status: Home / Self Care | Payer: Self-pay | Source: Ambulatory Visit | Attending: Licensed Practical Nurse | Admitting: Licensed Practical Nurse

## 2023-03-10 VITALS — BP 115/67 | HR 72 | Ht 61.0 in | Wt 108.5 lb

## 2023-03-10 DIAGNOSIS — Z113 Encounter for screening for infections with a predominantly sexual mode of transmission: Secondary | ICD-10-CM | POA: Insufficient documentation

## 2023-03-10 DIAGNOSIS — R300 Dysuria: Secondary | ICD-10-CM | POA: Diagnosis not present

## 2023-03-10 LAB — POCT URINALYSIS DIPSTICK
Bilirubin, UA: NEGATIVE
Blood, UA: NEGATIVE
Glucose, UA: NEGATIVE
Ketones, UA: NEGATIVE
Leukocytes, UA: NEGATIVE
Nitrite, UA: NEGATIVE
Protein, UA: NEGATIVE
Spec Grav, UA: 1.015 (ref 1.010–1.025)
Urobilinogen, UA: 1 U/dL
pH, UA: 6.5 (ref 5.0–8.0)

## 2023-03-10 NOTE — Progress Notes (Addendum)
  Gynecology STD Evaluation   Chief Complaint: Her for STI testing  Dysuria: does not occur every time she urinates, mostly when she drinks soda or juice. She was treated for a UTI 1 month ago.    History of Present Illness: Patient is a 27 y.o. G1P0010 presents for STD testing.  The patient has not noted intermenstrual spotting,  has not experienced postcoital bleeding, and does report increased vaginal discharge.  There is not a history of prior sexually transmitted infection(s).     The patient is sexually active and reports having had 2 female partners in the last 3 months. She currently uses Condoms for contraception. yes, the patient also relies on condoms to prevent the spread of sexually transmitted infections.  She patient has been previously transfused or tattooed.    Pt has tried various forms of contraception, the IUD caused her to bleeding continuously. She used Depo fo r3 years with good results, stopped because she read Depo is not good for long term birth control. She prefers to not use hormonal birth control.   PMHx: She  has a past medical history of Genital herpes (12/2018), Headache, Left knee pain, and Migraine with aura. Also,  has a past surgical history that includes Breast lumpectomy (Left, 03/01/2015)., family history includes Diabetes in her maternal grandmother; Hypertension in her mother.,  reports that she has never smoked. She has never used smokeless tobacco. She reports current alcohol use. She reports that she does not use drugs.  She has a current medication list which includes the following prescription(s): sumatriptan and nitrofurantoin (macrocrystal-monohydrate). Also, is allergic to penicillins.  ROS see HPI   Objective: BP 115/67   Pulse 72   Ht 5\' 1"  (1.549 m)   Wt 108 lb 8 oz (49.2 kg)   LMP 02/21/2023 (Exact Date)   BMI 20.50 kg/m  Physical Exam Constitutional:      Appearance: Normal appearance.  Genitourinary:     Vulva normal.      Genitourinary Comments: External exam WNL Normal urethral meatus, no lesions Cervix pink, no lesions, thick white discharge present, no bleeding      Vaginal discharge present.  Musculoskeletal:        General: Normal range of motion.  Neurological:     General: No focal deficit present.     Mental Status: She is alert.  Skin:    General: Skin is warm.  Psychiatric:        Mood and Affect: Mood normal.   UA negative for all components   Assessment: 27 y.o. G1P0010 No problem-specific Assessment & Plan notes found for this encounter.   Plan: Problem List Items Addressed This Visit   None Visit Diagnoses     Screening examination for venereal disease    -  Primary   Relevant Orders   Cervicovaginal ancillary only   Burning with urination       Relevant Orders   POCT Urinalysis Dipstick (Completed)        1) Contraception - Education given regarding options for contraception, including barrier methods.   2) STI screening was offered swab collected, declines blood work  3) last annual exam 06/10/2021  Carie Caddy, CNM  [facility]  03/10/23  2:46 PM

## 2023-03-12 ENCOUNTER — Other Ambulatory Visit: Payer: Self-pay | Admitting: Licensed Practical Nurse

## 2023-03-12 DIAGNOSIS — N760 Acute vaginitis: Secondary | ICD-10-CM

## 2023-03-12 DIAGNOSIS — B3731 Acute candidiasis of vulva and vagina: Secondary | ICD-10-CM

## 2023-03-12 DIAGNOSIS — A749 Chlamydial infection, unspecified: Secondary | ICD-10-CM

## 2023-03-12 LAB — CERVICOVAGINAL ANCILLARY ONLY
Bacterial Vaginitis (gardnerella): POSITIVE — AB
Candida Glabrata: NEGATIVE
Candida Vaginitis: POSITIVE — AB
Chlamydia: POSITIVE — AB
Comment: NEGATIVE
Comment: NEGATIVE
Comment: NEGATIVE
Comment: NEGATIVE
Comment: NEGATIVE
Comment: NORMAL
Neisseria Gonorrhea: NEGATIVE
Trichomonas: NEGATIVE

## 2023-03-12 MED ORDER — AZITHROMYCIN 500 MG PO TABS: 1000.0000 mg | ORAL_TABLET | Freq: Once | ORAL | 1 refills | Status: AC

## 2023-03-12 MED ORDER — FLUCONAZOLE 150 MG PO TABS: 150.0000 mg | ORAL_TABLET | ORAL | 0 refills | Status: AC

## 2023-03-12 MED ORDER — METRONIDAZOLE 500 MG PO TABS: 500.0000 mg | ORAL_TABLET | Freq: Two times a day (BID) | ORAL | 0 refills | Status: AC

## 2023-03-12 NOTE — Progress Notes (Signed)
Pt seen for STI testing Swab shows Chlamydia, BV and Yeast. Called and LVM Script for treatment sent, mychart message sent  Jannifer Hick   [facility]  03/12/23  1:07 PM

## 2023-03-18 ENCOUNTER — Ambulatory Visit: Payer: Medicaid Other

## 2023-04-17 ENCOUNTER — Encounter: Payer: Self-pay | Admitting: Emergency Medicine

## 2023-04-17 ENCOUNTER — Emergency Department
Admission: EM | Admit: 2023-04-17 | Discharge: 2023-04-17 | Disposition: A | Discharge status: Home / Self Care | Payer: Medicaid Other | Attending: Emergency Medicine | Admitting: Emergency Medicine

## 2023-04-17 ENCOUNTER — Emergency Department: Payer: Medicaid Other

## 2023-04-17 ENCOUNTER — Other Ambulatory Visit (HOSPITAL_COMMUNITY): Payer: Self-pay

## 2023-04-17 DIAGNOSIS — T7621XA Adult sexual abuse, suspected, initial encounter: Secondary | ICD-10-CM | POA: Diagnosis present

## 2023-04-17 DIAGNOSIS — M545 Low back pain, unspecified: Secondary | ICD-10-CM | POA: Diagnosis not present

## 2023-04-17 DIAGNOSIS — R000 Tachycardia, unspecified: Secondary | ICD-10-CM | POA: Diagnosis not present

## 2023-04-17 DIAGNOSIS — T7421XA Adult sexual abuse, confirmed, initial encounter: Secondary | ICD-10-CM

## 2023-04-17 LAB — COMPREHENSIVE METABOLIC PANEL
ALT: 12 U/L (ref 0–44)
AST: 18 U/L (ref 15–41)
Albumin: 4.6 g/dL (ref 3.5–5.0)
Alkaline Phosphatase: 43 U/L (ref 38–126)
Anion gap: 10 (ref 5–15)
BUN: 14 mg/dL (ref 6–20)
CO2: 22 mmol/L (ref 22–32)
Calcium: 9.1 mg/dL (ref 8.9–10.3)
Chloride: 109 mmol/L (ref 98–111)
Creatinine, Ser: 0.62 mg/dL (ref 0.44–1.00)
GFR, Estimated: >60 mL/min (ref >60)
Glucose, Bld: 87 mg/dL (ref 70–99)
Potassium: 3.4 mmol/L — ABNORMAL LOW (ref 3.5–5.1)
Sodium: 141 mmol/L (ref 135–145)
Total Bilirubin: 1.4 mg/dL — ABNORMAL HIGH (ref 0.3–1.2)
Total Protein: 7.8 g/dL (ref 6.5–8.1)

## 2023-04-17 LAB — POC URINE PREG, ED: Preg Test, Ur: NEGATIVE — NL

## 2023-04-17 LAB — URINALYSIS, ROUTINE W REFLEX MICROSCOPIC
Bilirubin Urine: NEGATIVE
Glucose, UA: NEGATIVE mg/dL
Hgb urine dipstick: NEGATIVE
Ketones, ur: NEGATIVE mg/dL
Leukocytes,Ua: NEGATIVE
Nitrite: NEGATIVE
Protein, ur: NEGATIVE mg/dL
Specific Gravity, Urine: 1.018 (ref 1.005–1.030)
pH: 8 (ref 5.0–8.0)

## 2023-04-17 LAB — RAPID HIV SCREEN (HIV 1/2 AB+AG)
HIV 1/2 Antibodies: NONREACTIVE
HIV-1 P24 Antigen - HIV24: NONREACTIVE

## 2023-04-17 MED ORDER — ELVITEG-COBIC-EMTRICIT-TENOFAF 150-150-200-10 MG PO TABS
1.0000 | ORAL_TABLET | Freq: Every day | ORAL | 0 refills | Status: AC
  Filled 2023-04-17 – 2023-04-19 (×2): qty 30, 30d supply, fill #0

## 2023-04-17 MED ORDER — ULIPRISTAL ACETATE 30 MG PO TABS
30.0000 mg | ORAL_TABLET | Freq: Once | ORAL | Status: AC
  Administered 2023-04-17: 30 mg via ORAL
  Filled 2023-04-17: qty 1

## 2023-04-17 MED ORDER — ONDANSETRON 4 MG PO TBDP
4.0000 mg | ORAL_TABLET | Freq: Once | ORAL | Status: DC
  Filled 2023-04-17: qty 1

## 2023-04-17 MED ORDER — ELVITEG-COBIC-EMTRICIT-TENOFAF 150-150-200-10 MG PREPACK
1.0000 | ORAL_TABLET | Freq: Once | ORAL | Status: AC
  Administered 2023-04-17: 5 via ORAL
  Filled 2023-04-17: qty 1

## 2023-04-17 MED ORDER — CEFTRIAXONE SODIUM 1 G IJ SOLR
500.0000 mg | Freq: Once | INTRAMUSCULAR | Status: AC
  Administered 2023-04-17: 500 mg via INTRAMUSCULAR
  Filled 2023-04-17: qty 10

## 2023-04-17 MED ORDER — ELVITEG-COBIC-EMTRICIT-TENOFAF 150-150-200-10 MG PO TABS: 1.0000 | ORAL_TABLET | Freq: Every day | ORAL | 0 refills | Status: DC

## 2023-04-17 MED ORDER — METRONIDAZOLE 500 MG PO TABS
2000.0000 mg | ORAL_TABLET | Freq: Once | ORAL | Status: AC
  Administered 2023-04-17: 2000 mg via ORAL
  Filled 2023-04-17: qty 4

## 2023-04-17 MED ORDER — AZITHROMYCIN 500 MG PO TABS
1000.0000 mg | ORAL_TABLET | Freq: Once | ORAL | Status: AC
  Administered 2023-04-17: 1000 mg via ORAL
  Filled 2023-04-17: qty 2

## 2023-04-17 MED ORDER — LIDOCAINE HCL (PF) 1 % IJ SOLN
1.0000 mL | Freq: Once | INTRAMUSCULAR | Status: AC
  Administered 2023-04-17: 1 mL
  Filled 2023-04-17: qty 5

## 2023-04-17 MED ORDER — CEFTRIAXONE SODIUM 500 MG IJ SOLR
500.0000 mg | Freq: Once | INTRAMUSCULAR | Status: DC
  Filled 2023-04-17: qty 500

## 2023-04-17 MED ORDER — PROMETHAZINE HCL 25 MG PO TABS
25.0000 mg | ORAL_TABLET | Freq: Four times a day (QID) | ORAL | Status: DC | PRN
  Administered 2023-04-17: 75 mg via ORAL
  Filled 2023-04-17 (×2): qty 1
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
## 2023-04-17 NOTE — Discharge Instructions (Addendum)
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 medication. Get rid of any unused medication after the expiration date. To get rid of medications that are no longer needed or have expired: Take the medication to a medication take-back program. Check with your pharmacy or law enforcement to find a location. If you cannot return the medication, ask your care team how to get rid of this medication safely. NOTE: This sheet is a summary. It may not cover all possible information. If you have questions about this medicine, talk to your doctor, pharmacist, or health care provider.  2022 Elsevier/Gold Standard (2020-07-23 09:56:16)         [x]    Zithromax (Azithromycin) Tablets     What is this medication? AZITHROMYCIN (az ith roe MYE sin) treats infections caused by bacteria. It belongs to a group of medications called antibiotics. It will not treat colds, the flu, or infections caused by viruses. This medicine may be used for other purposes; ask  your health care provider or pharmacist if you have questions. COMMON BRAND NAME(S): Zithromax, Zithromax Tri-Pak, Zithromax Z-Pak What should I tell my care team before I take this medication? They need to know if you have any of these conditions: History of blood diseases, like leukemia History of irregular heartbeat Kidney disease Liver disease Myasthenia gravis An unusual or allergic reaction to azithromycin, erythromycin, other macrolide antibiotics, foods, dyes, or preservatives Pregnant or trying to get pregnant Breast-feeding How should I use this medication? Take this medication by mouth with a full glass of water. Follow the directions on the prescription label. The tablets can be taken with food or on an empty stomach. If the medication upsets your stomach, take it with food. Take your medication at regular intervals. Do not take your medication more often than directed. Take all of your medication as directed even if you think you are better. Do not skip doses or stop your medication early. Talk to your care team regarding the use of this medication in children. While this medication may be prescribed for children as young as 6 months for selected conditions, precautions do apply. Overdosage: If you think you have taken too much of this medicine contact a poison control center or emergency room at once. NOTE: This medicine is only for you. Do not share this medicine with others. What if I miss a dose? If you miss a dose, take it as soon as you can. If it is almost time for your next dose, take only that dose. Do not take double or extra doses. What may interact with this medication? Do not take this medication with any of the following: Cisapride Dronedarone Pimozide Thioridazine This medication may also interact with the following: Antacids that contain aluminum or magnesium Birth control pills Colchicine Cyclosporine Digoxin Ergot alkaloids like dihydroergotamine,  ergotamine Nelfinavir Other medications that prolong the QT interval (an abnormal heart rhythm) Phenytoin Warfarin This list may not describe all possible interactions. Give your health care provider a list of all the medicines, herbs, non-prescription drugs, or dietary supplements you use. Also tell them if you smoke, drink alcohol, or use illegal drugs. Some items may interact with your medicine. What should I watch for while using this medication? Tell your care team if your symptoms do not start to get better or if they get worse. This medication may cause serious skin reactions. They can happen weeks to months after starting the medication. Contact your care team right away if you notice fevers or flu-like symptoms with a rash. The rash may be red or purple and then turn into blisters or peeling of the skin. Or, you might notice a red rash with swelling of the face, lips or lymph nodes in your neck or under your arms. Do not treat diarrhea with over the counter products. Contact your care team if you have diarrhea that lasts more than 2 days or if it is severe and watery. This medication can make you more sensitive to the sun. Keep out of the sun. If you cannot avoid being in the sun, wear protective clothing and use sunscreen. Do not use sun lamps or tanning beds/booths. What side effects may I notice from receiving this medication? Side effects that you should report to your care team as soon as possible: Allergic reactions or angioedema-skin rash, itching, hives, swelling of the face, eyes, lips, tongue, arms, or legs, trouble swallowing or breathing Heart rhythm changes-fast or irregular heartbeat, dizziness, feeling faint or lightheaded, chest pain, trouble breathing Liver injury-right upper belly pain, loss of appetite, nausea, light-colored stool, dark yellow or brown urine, yellowing skin or eyes, unusual weakness or fatigue Rash, fever, and swollen lymph nodes Redness, blistering,  peeling, or loosening of the skin, including inside the mouth Severe diarrhea, fever Unusual vaginal discharge, itching, or odor Side effects that usually do not require medical attention (report to your care team if they continue or are bothersome): Diarrhea Nausea Stomach pain Vomiting This list may not describe all possible side effects. Call your doctor for medical advice about side effects. You may report side effects to FDA at 1-800-FDA-1088. Where should I keep my medication? Keep out of the reach of children and pets. Store at room temperature between 15 and 30 degrees C (59 and 86 degrees F). Throw away any unused medication after the expiration date. NOTE: This sheet is a summary. It may not cover all possible information. If you have questions about this medicine, talk to your doctor, pharmacist, or health care provider.  2022 [employer]/Gold Standard (2020-05-08 11:19:31)         [x]    Flagyl (Metronidazole) Capsules or Tablets               TAKE ALL 4 PILLS AT THE SAME TIME.  DO NOT TAKE THIS MEDICATION FOR 3 DAYS (72 HOURS) AFTER DRINKING ALCOHOL,  AND DO NOT DRINK ANY ALCOHOL FOR 3 DAYS (72 HOURS) AFTER YOU TAKE THIS MEDICATION !   What is this medication? METRONIDAZOLE (me troe NI da zole) treats infections caused by bacteria or parasites. It belongs to a group of medications called antibiotics. It will not treat colds, the flu, or infections caused by viruses. This medicine may be used for other purposes; ask your health care provider or pharmacist if you have questions. COMMON BRAND NAME(S): Flagyl What  should I tell my care team before I take this medication? They need to know if you have any of these conditions: Cockayne syndrome History of blood diseases such as sickle cell anemia, anemia, or leukemia If you often drink alcohol Irregular heartbeat or rhythm Kidney disease Liver disease Yeast or fungal infection An unusual or allergic reaction to metronidazole,  nitroimidazoles, or other medications, foods, dyes, or preservatives Pregnant or trying to get pregnant Breast-feeding How should I use this medication? Take this medication by mouth with water. Take it as directed on the prescription label at the same time every day. Take all of this medication unless your care team tells you to stop it early. Keep taking it even if you think you are better. Talk to your care team about the use of this medication in children. While it may be prescribed for children for selected conditions, precautions do apply. Overdosage: If you think you have taken too much of this medicine contact a poison control center or emergency room at once. NOTE: This medicine is only for you. Do not share this medicine with others. What if I miss a dose? If you miss a dose, take it as soon as you can. If it is almost time for your next dose, take only that dose. Do not take double or extra doses. What may interact with this medication? Do not take this medication with any of the following: Alcohol or any product that contains alcohol Cisapride Disulfiram Dronedarone Pimozide Thioridazine This medication may also interact with the following: Birth control pills Busulfan Carbamazepine Certain medications that treat or prevent blood clots like warfarin Cimetidine Lithium Other medications that prolong the QT interval (cause an abnormal heart rhythm) Phenobarbital Phenytoin This list may not describe all possible interactions. Give your health care provider a list of all the medicines, herbs, non-prescription drugs, or dietary supplements you use. Also tell them if you smoke, drink alcohol, or use illegal drugs. Some items may interact with your medicine. What should I watch for while using this medication? Tell your care team if your symptoms do not start to get better or if they get worse. Some products may contain alcohol. Ask your care team if this medication contains  alcohol. Be sure to tell all care teams you are taking this medication. Certain medications, such as metronidazole and disulfiram, can cause an unpleasant reaction when taken with alcohol. The reaction includes flushing, headache, nausea, vomiting, sweating, and increased thirst. The reaction can last from 30 minutes to several hours. If you are being treated for a sexually transmitted disease (STD), avoid sexual contact until you have finished your treatment. Your sexual partner may also need treatment. Birth control may not work properly while you are taking this medication. Talk to your care team about using an extra method of birth control. What side effects may I notice from receiving this medication? Side effects that you should report to your care team as soon as possible: Allergic reactions-skin rash, itching, hives, swelling of the face, lips, tongue, or throat Dizziness, loss of balance or coordination, confusion or trouble speaking Fever, neck pain or stiffness, sensitivity to light, headache, nausea, vomiting, confusion Heart rhythm changes-fast or irregular heartbeat, dizziness, feeling faint or lightheaded, chest pain, trouble breathing Liver injury-right upper belly pain, loss of appetite, nausea, light-colored stool, dark yellow or brown urine, yellowing skin or eyes, unusual weakness or fatigue Pain, tingling, or numbness in the hands or feet Redness, blistering, peeling, or loosening of the skin, including  inside the mouth Seizures Severe diarrhea, fever Sudden eye pain or change in vision such as blurry vision, seeing halos around lights, vision loss Unusual vaginal discharge, itching, or odor Side effects that usually do not require medical attention (report to your care team if they continue or are bothersome): Diarrhea Metallic taste in mouth Nausea Stomach pain This list may not describe all possible side effects. Call your doctor for medical advice about side effects. You  may report side effects to FDA at 1-800-FDA-1088. Where should I keep my medication? Keep out of the reach of children and pets. Store between 15 and 25 degrees C (59 and 77 degrees F). Protect from light. Get rid of any unused medication after the expiration date. To get rid of medications that are no longer needed or have expired: Take the medication to a medication take-back program. Check with your pharmacy or law enforcement to find a location. If you cannot return the medication, check the label or package insert to see if the medication should be thrown out in the garbage or flushed down the toilet. If you are not sure, ask your care team. If it is safe to put it in the trash, take the medication out of the container. Mix the medication with cat litter, dirt, coffee grounds, or other unwanted substance. Seal the mixture in a bag or container. Put it in the trash. NOTE: This sheet is a summary. It may not cover all possible information. If you have questions about this medicine, talk to your doctor, pharmacist, or health care provider.  2022 Elsevier/Gold Standard (2020-08-08 13:29:17)         [x]    Phenergan (Promethazine 25 mg) Tablets  PROMETHAZINE (proe-METH-a-zeen)  COMMON BRAND NAME(S):  Phenergan, Promacot, Promethazine Hydrochloride.  There may be other brand names for this medicine. Sexual Assault Specific:  This medication has been given to you to assist with nausea or possible sleeplessness.  You have been given three 25mg  tablets to take AS NEEDED.  You may take  -1 tablet every 6-8 hours as needed. USES:  This medication is an antihistamine.  It can be used to treat allergic reactions and to treat or prevent nausea and vomiting from illness or motion sickness.  It is also used to make you sleep before surgery, and to help treat pain or nausea after surgery.   HOW TO USE:  Your doctor or healthcare provider will tell you how much of this medicine to use and how often.  Take this  medicine by mouth with a glass of water or with food or milk.  Take your doses at regular intervals.  Do not take this medication more often than directed. SIDE EFFECTS:  You should report the following side effects to your doctor or healthcare provider as soon as possible:  blurred vision, irregular heartbeat, palpitations, or chest pain or tightness, muscle or facial twitches, pain or difficulty passing urine, dark-colored urine, pale stools, seizures, skin rash (including itching or hives), slowed or shallow breathing, trouble breathing, unusual bleeding or bruising, yellowing of the eyes or skin, swelling in your face or hands, swelling or tingling in your mouth or throat, fever, sweating, confusion, pain in your upper stomach, problems with balance, walking, or speech, seeing or hearing things that are not really there (especially in children). Side effects that usually do not require medical attention but you should report to your doctor or healthcare provider if they continue or are bothersome include:  headache, nightmares, agitation, nervousness, excitability,  not being able to sleep (more likely in children), stuffy  or runny nose, dry mouth, mild skin rash or itching, ringing in your ears.  Tell your doctor or healthcare provider if your symptoms do not start to get better in 1-2 days.  You may get drowsy or dizzy.  Do not drive, use machinery, or do anything that needs mental alertness until you know how this medication will affect you.  To reduce the risk of dizzy or fainting spells, do not stand or sit up too quickly, especially if you are an older patient.  Alcohol may increase dizziness and drowsiness. Your mouth can get dry.  Chewing sugarless gum or sucking on hard candy and drinking plenty of water may help.  Contact your doctor if the problem does not go away or is severe.  Since this medication can cause dry eyes and blurred vision, if you wear contact lenses you may feel some discomfort.   Lubricating drops may help.  See your eye doctor if the problem does not go away or is severe.  This medication can also make you sensitive to the sun.  Keep out of the sun.  If you cannot avoid being in the sun, wear protective clothing and use sunscreen.  Do not use sunlamps or tanning beds/booths.  If you are diabetic, check your blood sugar levels regularly. This list may not describe all possible side effects.  If you notice other effects not listed above, contact your doctor.  You may report side effects to the Food & Drug Administration (FDA) at 1-800-FDA-1088. PRECAUTIONS:  Your doctor or healthcare provider needs to know if you have any of the following conditions:  glaucoma, high blood pressure or heart disease, kidney disease, liver disease, lung disease or breathing problems (like asthma), prostate trouble, pain or difficulty passing urine, seizures, an unusual or allergic reaction to promethazine or phenothiazine medicines (e.g. perphenazine, thioridazine, Compazine, Thorazine, and Trilafon), other medicines, foods, dyes, or preservatives, if you are pregnant or trying to get pregnant, or breast-feeding.  Talk to your pediatrician regarding the use of this medicine in children.  Special care may be needed.  This medicine should not be given to infants and children younger than 63 years old.   Make sure your doctor or healthcare professional knows if you are pregnant or breast-feeding.  Tell your doctor if you have Chronic Obstructive Pulmonary Disease (COPD), asthma, sleep apnea, if you have or have ever had Neuroleptic Malignant Syndrome (NMS),  DRUG INTERACTIONS:  Do not take this medication with any of the following medications:  medicines called MAO Inhibitors (e.g. Nardil, Parnate, Marplan, Eldepryl), or other phenothiazines like trimethobenzamide.  This medication may also interact with the following medications:  barbiturates like phenobarbital, bromocriptine, certain antidepressants, certain  antihistamines used in allergy or cold medicines, epinephrine, levodopa, medications for sleep, medications for mental problems & psychotic disturbances (e.g. amitriptyline, doxepin, nortriptyline, phenylzine, selegiline, Elavil, Pamelor, Sinequan), medications for movement abnormalities such as Parkinson's Disease, medications for gastrointestinal problems, muscle relaxants, narcotic pain medications, sedatives.  Do not drink alcohol while using this medicine. This document does not contain all possible interactions.  Therefore, before using this product, tell your doctor or healthcare provider of all the products you use.  Keep a list of all your medications with you, and share the list with your doctor or healthcare provider. NOTES:  Do not share this medication with others.  If you think you have taken too much of this medicine, contact a poison control  center or emergency room at once. MISSED DOSE:  If you miss a dose, take it as soon as you can.  If it is almost time for your next dose, take only that dose.  Do not take double or extra doses. STORAGE:  Store at room temperature between 68-77 degrees F (20-25 degrees C), away from light and moisture.  Do not store in the bathroom.  Keep all medicines away from children and pets.  Do not flush medications down the toilet or pour them into the drain unless instructed to do so.  Properly discard this product when it is expired or no longer needed.  Consult your pharmacist or local waste disposal company for more details about how to safely discard this product.          [x]    Genvoya (Elvitegravir, Cobicistat, Emtricitabine, Tenofovir Alafenamide) Tablets   What is this medication? ELVITEGRAVIR; COBICISTAT; EMTRICITABINE; TENOFOVIR ALAFENAMIDE (el vye TEG ra veer; koe BIS i stat; em tri SIT uh bean; te NOE fo veer) is 3 antiretroviral medicines and a medication booster in 1 tablet. It is used to treat HIV. This medicine is not a cure for HIV. This  medicine can lower, but not fully prevent, the risk of spreading HIV to others. This medicine may be used for other purposes; ask your health care provider or pharmacist if you have questions. COMMON BRAND NAME(S): Genvoya What should I tell my care team before I take this medication? They need to know if you have any of these conditions: kidney disease liver disease an unusual or allergic reaction to elvitegravir, cobicistat, emtricitabine, tenofovir, other medicines, foods, dyes, or preservatives pregnant or trying to get pregnant breast-feeding How should I use this medication? Take this medicine by mouth with a glass of water. Follow the directions on the prescription label. Take this medicine with food. Take your medicine at regular intervals. Do not take your medicine more often than directed. For your anti-HIV therapy to work as well as possible, take each dose exactly as prescribed. Do not skip doses or stop your medicine even if you feel better. Skipping doses may make the HIV virus resistant to this medicine and other medicines. Do not stop taking except on your doctor's advice. Talk to your pediatrician regarding the use of this medicine in children. While this drug may be prescribed for selected conditions, precautions do apply. Overdosage: If you think you have taken too much of this medicine contact a poison control center or emergency room at once. NOTE: This medicine is only for you. Do not share this medicine with others. What if I miss a dose? If you miss a dose, take it as soon as you can. If it is almost time for your next dose, take only that dose. Do not take double or extra doses. What may interact with this medication? Do not take this medicine with any of the following medications: adefovir alfuzosin certain medicines for seizures like carbamazepine, phenobarbital, phenytoin cisapride irinotecan lumacaftor; ivacaftor lurasidone medicines for cholesterol like  lovastatin, simvastatin medicines for headaches like dihydroergotamine, ergotamine, methylergonovine midazolam naloxegol other antiviral medicines for HIV or AIDS pimozide rifampin sildenafil [facility] John's wort triazolam This medicine may also interact with the following medications: antacids atorvastatin bosentan buprenorphine; naloxone certain antibiotics like clarithromycin, telithromycin, rifabutin, rifapentine certain medications for anxiety or sleep like buspirone, clorazepate, diazepam, estazolam, flurazepam, zolpidem certain medicines for blood pressure or heart disease like amlodipine, diltiazem, felodipine, metoprolol, nicardipine, nifedipine, timolol, verapamil certain medicines for depression,  anxiety, or psychiatric disturbances certain medicines for erectile dysfunction like avanafil, sildenafil, tadalafil, vardenafil certain medicines for fungal infection like itraconazole, ketoconazole, voriconazole certain medicines that treat or prevent blood clots like warfarin, apixaban, betrixaban, dabigatran, edoxaban, and rivaroxaban colchicine cyclosporine female hormones, like estrogens and progestins and birth control pills medicines for infection like acyclovir, cidofovir, valacyclovir, ganciclovir, valganciclovir medicines for irregular heart beat like amiodarone, bepridil, digoxin, disopyramide, dofetilide, flecainide, lidocaine, mexiletine, propafenone, quinidine metformin oxcarbazepine phenothiazines like perphenazine, risperidone, thioridazine salmeterol sirolimus steroid medicines like betamethasone, budesonide, ciclesonide, dexamethasone, fluticasone, methylprednisolone, mometasone, triamcinolone tacrolimus This list may not describe all possible interactions. Give your health care provider a list of all the medicines, herbs, non-prescription drugs, or dietary supplements you use. Also tell them if you smoke, drink alcohol, or use illegal drugs. Some items may interact  with your medicine. What should I watch for while using this medication? Visit your doctor or health care professional for regular check ups. Discuss any new symptoms with your doctor. You will need to have important blood work done while on this medicine. HIV is spread to others through sexual or blood contact. Talk to your doctor about how to stop the spread of HIV. If you have hepatitis B, talk to your doctor if you plan to stop this medicine. The symptoms of hepatitis B may get worse if you stop this medicine. Birth control pills may not work properly while you are taking this medicine. Talk to your doctor about using an extra method of birth control. Women who can still have children must use a reliable form of barrier contraception, like a condom. What side effects may I notice from receiving this medication? Side effects that you should report to your doctor or health care professional as soon as possible: allergic reactions like skin rash, itching or hives, swelling of the face, lips, or tongue breathing problems fast, irregular heartbeat muscle pain or weakness signs and symptoms of kidney injury like trouble passing urine or change in the amount of urine signs and symptoms of liver injury like dark yellow or brown urine; general ill feeling or flu-like symptoms; light-colored stools; loss of appetite; right upper belly pain; unusually weak or tired; yellowing of the eyes or skin Side effects that usually do not require medical attention (report to your doctor or health care professional if they continue or are bothersome): diarrhea headache nausea tiredness This list may not describe all possible side effects. Call your doctor for medical advice about side effects. You may report side effects to FDA at 1-800-FDA-1088. Where should I keep my medication? Keep out of the reach of children. Store at room temperature below 30 degrees C (86 degrees F). Throw away any unused medicine after  the expiration date. NOTE: This sheet is a summary. It may not cover all possible information. If you have questions about this medicine, talk to your doctor, pharmacist, or health care provider.  2022 Elsevier/Gold Standard (2019-05-16 17:54:51)

## 2023-04-17 NOTE — ED Notes (Signed)
Spoke with Dorene Sorrow, with SANE and states that she will be here in approx 45 to evaluate pt.

## 2023-04-18 LAB — RPR: RPR Ser Ql: NONREACTIVE

## 2023-04-18 LAB — HEPATITIS B SURFACE ANTIGEN: Hepatitis B Surface Ag: NONREACTIVE

## 2023-04-18 LAB — HEPATITIS C ANTIBODY: HCV Ab: NONREACTIVE
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
## 2023-04-18 NOTE — SANE Note (Addendum)
  The SANE/FNE Teacher, music) consult has been completed. The primary RN and ED Provider, Dr Trinna Post, have been notified.  Please contact the SANE/FNE nurse on call (listed in Amion) with any further concerns.
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
## 2023-04-19 ENCOUNTER — Other Ambulatory Visit (HOSPITAL_COMMUNITY): Payer: Self-pay

## 2023-07-17 IMAGING — US US OB COMP LESS 14 WK
1 series · 15 of 28 positions shown · non-contrast
Comparison: None.

CLINICAL DATA: Pregnancy with vaginal bleeding.

EXAM:
OBSTETRIC <14 WK US AND TRANSVAGINAL OB US
TECHNIQUE: Both transabdominal and transvaginal ultrasound examinations were
performed for complete evaluation of the gestation as well as the
maternal uterus, adnexal regions, and pelvic cul-de-sac.
Transvaginal technique was performed to assess early pregnancy.

[Series 1: us ob comp less 14 wks · 15 of 56 slices shown]
[im 1/56]
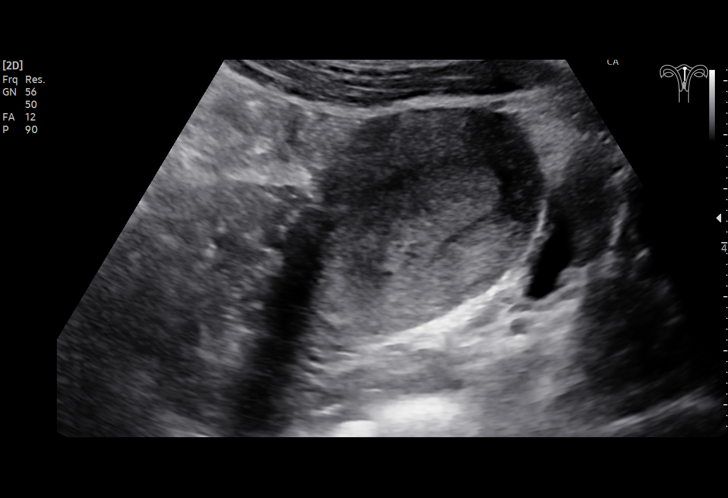
[im 5/56]
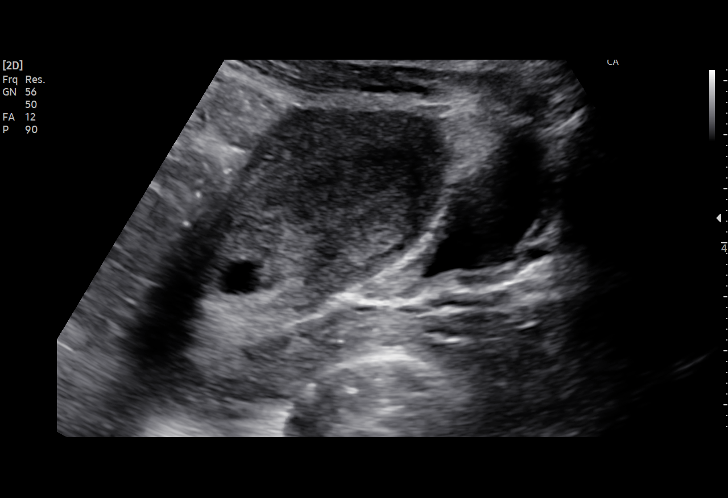
[im 9/56]
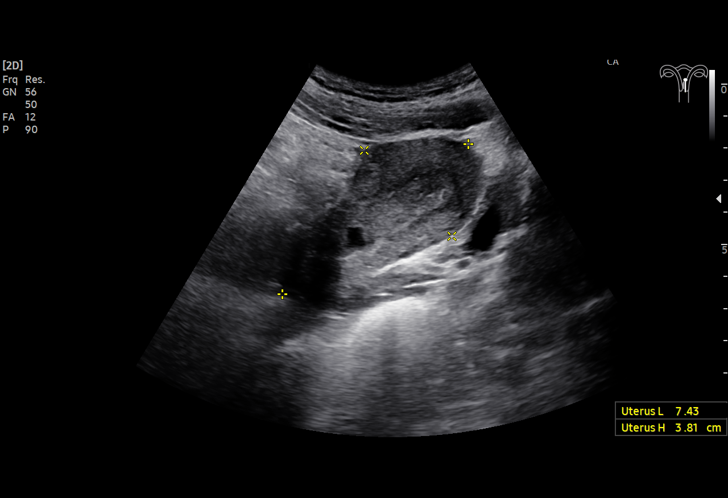
[im 13/56]
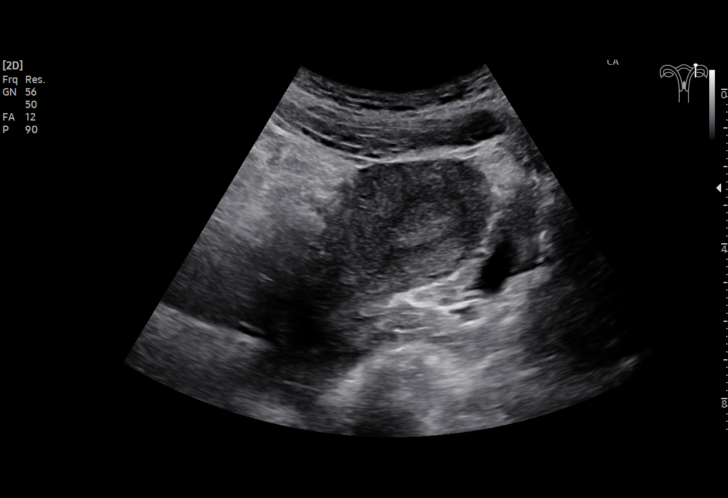
[im 17/56]
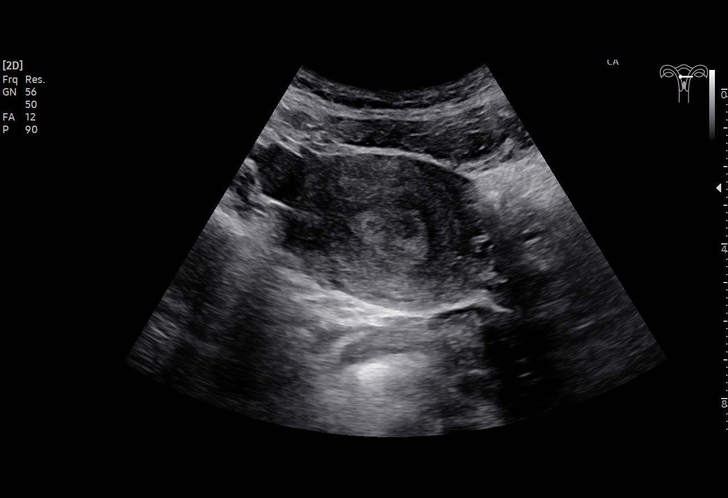
[im 21/56]
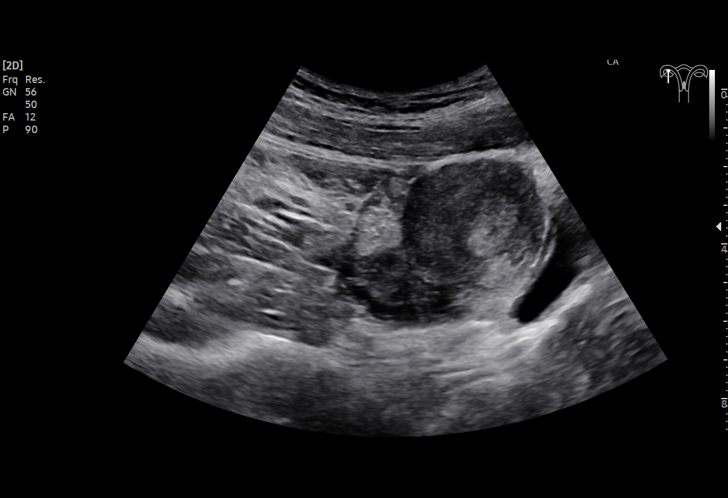
[im 25/56]
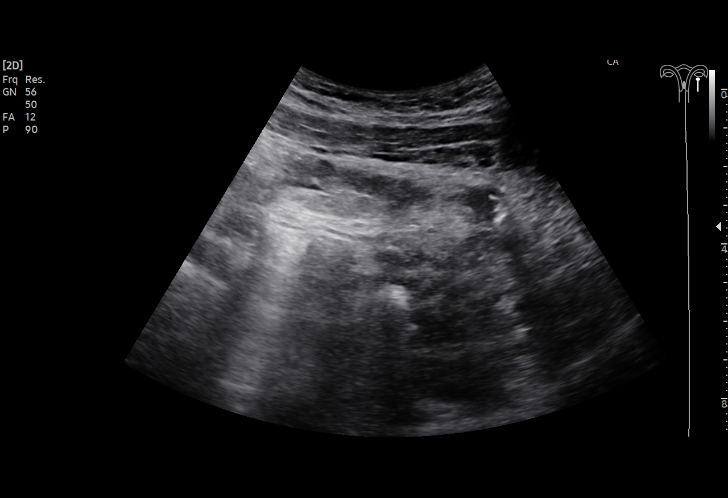
[im 29/56]
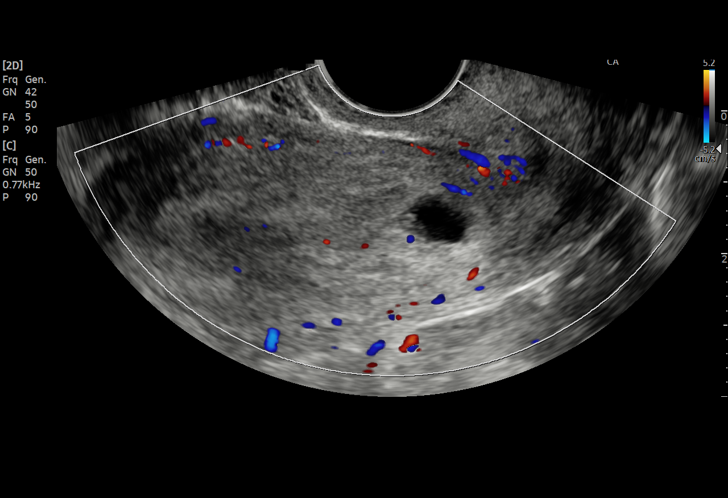
[im 31/56]
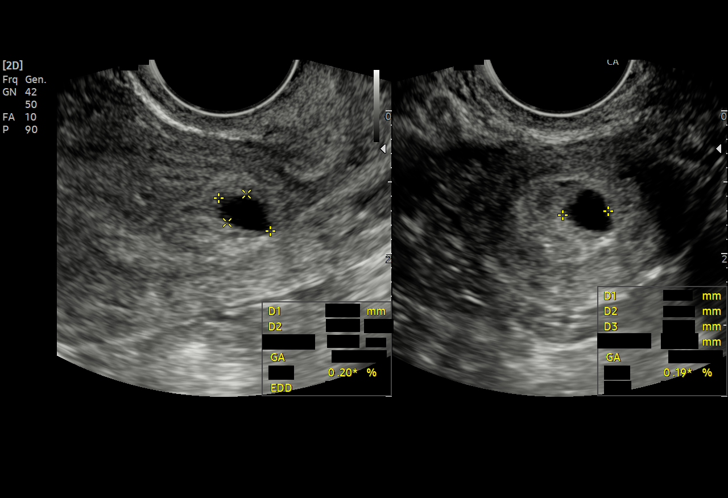
[im 35/56]
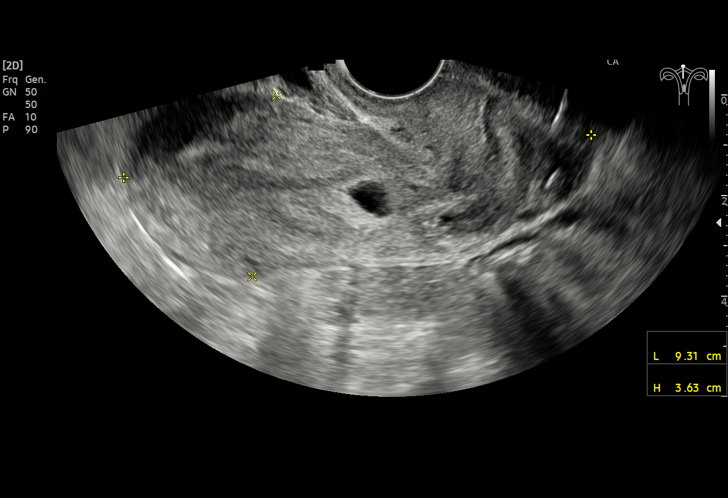
[im 39/56]
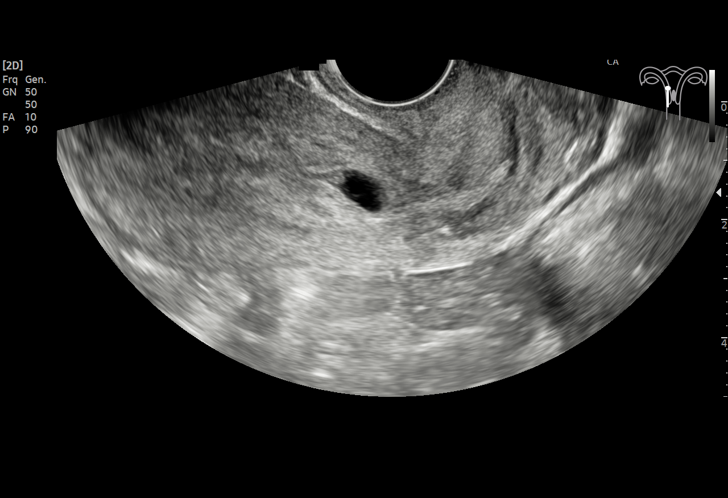
[im 43/56]
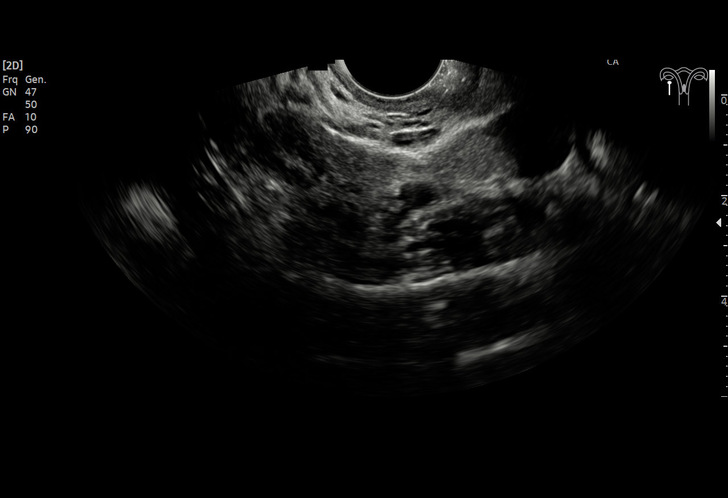
[im 47/56]
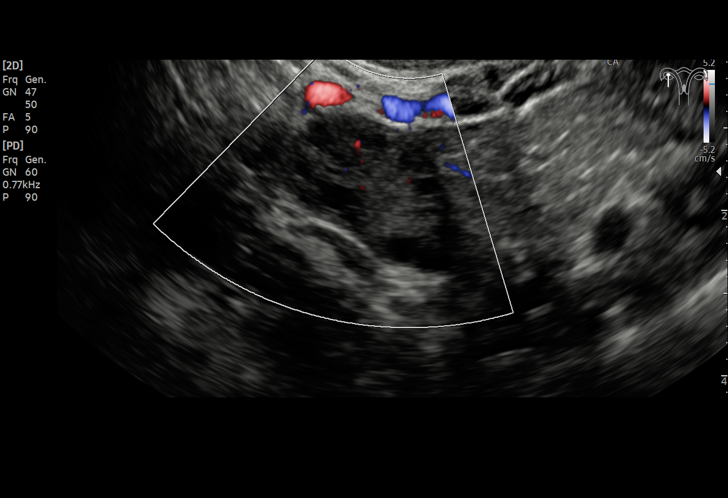
[im 51/56]
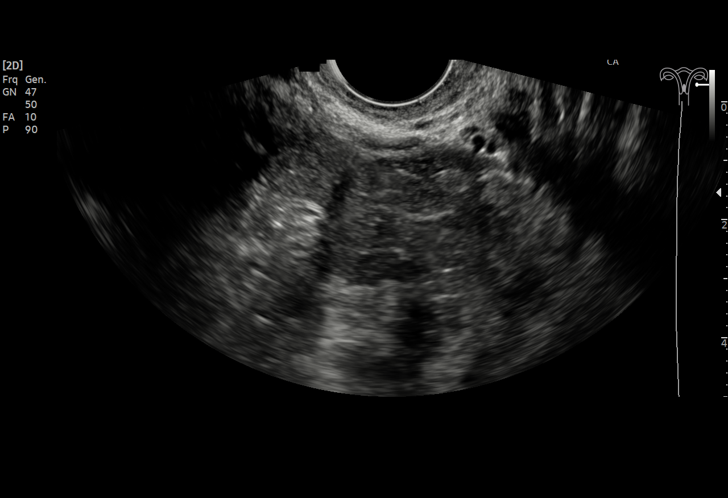
[im 56/56]
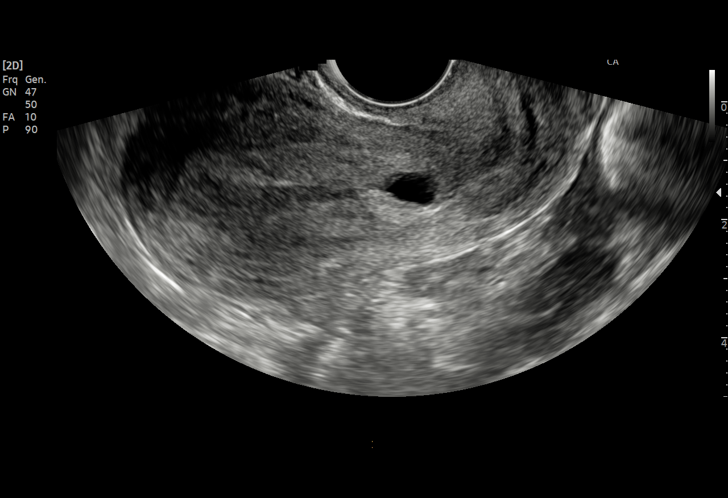

[15 of 28 positions shown; findings below may reference images not displayed]

FINDINGS: Intrauterine gestational sac: Single

Yolk sac:  Not Visualized.

Embryo:  Not Visualized.

Cardiac Activity: Not Visualized.

MSD: 6.6 mm   5 w   3 d

US EDC:

Subchorionic hemorrhage:  None visualized.

Maternal uterus/adnexae: Gestational sac is in the lower uterine
segment. Normal appearance of the right ovary measuring 2.4 x 1.5 x
2.0 cm. Normal appearance of the left ovary measuring 2.6 x 1.9 x
2.2 cm. Probable small nabothian cyst. No significant free fluid.
IMPRESSION: Intrauterine gestational sac is present. Calculated gestational age
is 5 weeks and 3 days.

## 2024-01-12 ENCOUNTER — Encounter: Payer: Self-pay | Admitting: Family Medicine

## 2024-01-12 ENCOUNTER — Ambulatory Visit: Payer: Self-pay | Admitting: Family Medicine

## 2024-01-12 DIAGNOSIS — Z113 Encounter for screening for infections with a predominantly sexual mode of transmission: Secondary | ICD-10-CM

## 2024-01-12 DIAGNOSIS — B3731 Acute candidiasis of vulva and vagina: Secondary | ICD-10-CM

## 2024-01-12 LAB — WET PREP FOR TRICH, YEAST, CLUE
Clue Cell Exam: NEGATIVE
Trichomonas Exam: NEGATIVE

## 2024-01-12 LAB — HM HIV SCREENING LAB: HM HIV Screening: NEGATIVE

## 2024-01-12 MED ORDER — FLUCONAZOLE 150 MG PO TABS: 150.0000 mg | ORAL_TABLET | Freq: Once | ORAL | Status: AC
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
## 2024-01-13 ENCOUNTER — Ambulatory Visit: Payer: Self-pay | Admitting: Family Medicine

## 2024-01-13 NOTE — Progress Notes (Signed)
 Wet prep reviewed with patient during visit.   Dorothyann Helling, MD 01/13/24  11:54 AM

## 2024-01-17 LAB — GONOCOCCUS CULTURE

## 2024-01-21 ENCOUNTER — Encounter: Payer: Self-pay | Admitting: Family Medicine
